# Patient Record
Sex: Female | Born: 1965
Health system: Southern US, Community
[De-identification: ages and names within clinical notes are randomized; demographics above are authoritative.]

## PROBLEM LIST (undated history)

## (undated) DIAGNOSIS — E785 Hyperlipidemia, unspecified: Secondary | ICD-10-CM

## (undated) DIAGNOSIS — D509 Iron deficiency anemia, unspecified: Secondary | ICD-10-CM

## (undated) DIAGNOSIS — Z8759 Personal history of other complications of pregnancy, childbirth and the puerperium: Secondary | ICD-10-CM

## (undated) DIAGNOSIS — Z87820 Personal history of traumatic brain injury: Secondary | ICD-10-CM

## (undated) DIAGNOSIS — N9489 Other specified conditions associated with female genital organs and menstrual cycle: Secondary | ICD-10-CM

## (undated) DIAGNOSIS — B009 Herpesviral infection, unspecified: Secondary | ICD-10-CM

## (undated) DIAGNOSIS — G43909 Migraine, unspecified, not intractable, without status migrainosus: Secondary | ICD-10-CM

## (undated) DIAGNOSIS — N950 Postmenopausal bleeding: Secondary | ICD-10-CM

## (undated) DIAGNOSIS — Z973 Presence of spectacles and contact lenses: Secondary | ICD-10-CM

## (undated) DIAGNOSIS — Q6589 Other specified congenital deformities of hip: Secondary | ICD-10-CM

## (undated) HISTORY — DX: Herpesviral infection, unspecified: B00.9

## (undated) HISTORY — DX: Other specified congenital deformities of hip: Q65.89

## (undated) HISTORY — PX: OTHER SURGICAL HISTORY: SHX169

## (undated) HISTORY — DX: Hyperlipidemia, unspecified: E78.5

## (undated) HISTORY — DX: Migraine, unspecified, not intractable, without status migrainosus: G43.909

---

## 1990-10-29 HISTORY — PX: SALPINGOSTOMY: SHX2372

## 1999-10-01 ENCOUNTER — Other Ambulatory Visit: Admission: RE | Admit: 1999-10-01 | Discharge: 1999-10-01 | Payer: Self-pay | Admitting: Obstetrics and Gynecology

## 2000-10-13 ENCOUNTER — Other Ambulatory Visit: Admission: RE | Admit: 2000-10-13 | Discharge: 2000-10-13 | Payer: Self-pay | Admitting: *Deleted

## 2001-12-19 ENCOUNTER — Other Ambulatory Visit: Admission: RE | Admit: 2001-12-19 | Discharge: 2001-12-19 | Payer: Self-pay | Admitting: Obstetrics and Gynecology

## 2002-12-21 ENCOUNTER — Other Ambulatory Visit: Admission: RE | Admit: 2002-12-21 | Discharge: 2002-12-21 | Payer: Self-pay | Admitting: Obstetrics and Gynecology

## 2004-01-01 ENCOUNTER — Other Ambulatory Visit: Admission: RE | Admit: 2004-01-01 | Discharge: 2004-01-01 | Payer: Self-pay | Admitting: *Deleted

## 2005-02-05 ENCOUNTER — Other Ambulatory Visit: Admission: RE | Admit: 2005-02-05 | Discharge: 2005-02-05 | Payer: Self-pay | Admitting: Obstetrics and Gynecology

## 2006-02-09 ENCOUNTER — Other Ambulatory Visit: Admission: RE | Admit: 2006-02-09 | Discharge: 2006-02-09 | Payer: Self-pay | Admitting: Obstetrics and Gynecology

## 2007-02-15 ENCOUNTER — Other Ambulatory Visit: Admission: RE | Admit: 2007-02-15 | Discharge: 2007-02-15 | Payer: Self-pay | Admitting: Obstetrics and Gynecology

## 2007-04-24 ENCOUNTER — Encounter: Payer: Self-pay | Admitting: Obstetrics and Gynecology

## 2007-04-24 ENCOUNTER — Ambulatory Visit (HOSPITAL_BASED_OUTPATIENT_CLINIC_OR_DEPARTMENT_OTHER): Admission: RE | Admit: 2007-04-24 | Discharge: 2007-04-24 | Payer: Self-pay | Admitting: Obstetrics and Gynecology

## 2008-03-13 ENCOUNTER — Other Ambulatory Visit: Admission: RE | Admit: 2008-03-13 | Discharge: 2008-03-13 | Payer: Self-pay | Admitting: Obstetrics and Gynecology

## 2010-04-20 LAB — HM PAP SMEAR

## 2011-01-05 ENCOUNTER — Other Ambulatory Visit: Payer: Self-pay | Admitting: Allergy and Immunology

## 2011-01-05 DIAGNOSIS — R0981 Nasal congestion: Secondary | ICD-10-CM

## 2011-01-12 NOTE — Op Note (Signed)
Allison Crawford, Allison Crawford              ACCOUNT NO.:  [id number]   MEDICAL RECORD NO.:  [id number]          PATIENT TYPE:  AMB   LOCATION:  [facility]                         FACILITY:  [facility]   PHYSICIAN:  Cynthia P. Romine, M.D.DATE OF BIRTH:  1966-08-14   DATE OF PROCEDURE:  04/24/2007  DATE OF DISCHARGE:                               OPERATIVE REPORT   PREOPERATIVE DIAGNOSIS:  Menorrhagia, endometrial polyps on  sonohysterogram.   POSTOPERATIVE DIAGNOSIS:  Menorrhagia, endometrial polyps, path pending.   PROCEDURE:  Examination under anesthesia, hysteroscopy with resection of  endometrial polyp, D&C.   SURGEON:  Cynthia P. Romine, M.D.   ANESTHESIA:  General by LMA.   BLOOD LOSS:  Minimal.   SORBITOL DEFICIT:  20 mL.   COMPLICATIONS:  None.   PROCEDURE:  The patient was taken to the operating room and after  induction of adequate general anesthesia was placed in dorsal lithotomy  position and prepped and draped in the usual fashion.  The bladder was  drained with a red rubber catheter.  A posterior weighted and anterior  Sims retractor were placed.  The cervix was grasped on its anterior lip  with a single-tooth tenaculum.  Paracervical block was instituted by  injecting 10 mL of 1% plain Xylocaine at each of 3 and 9 o'clock. The  uterus was then sounded to 8 cm.  The cervix was then dilated to #31  Shawnie Pons.  The cervix offered very little resistance with dilation. The  hysteroscope was introduced.  Several endometrial polyps were noted in  the lower uterine segment and the right and left cornual areas. Several  passes were made to resect the polyps with the resectoscope, scope was  then withdrawn.  Sharp curettage was carried out a moderate amount of  tissue was obtained.  The scope was then reinserted, one more polyp was  resected and photographic documentation was taken of the clean cavity.  Scope was withdrawn.  The instruments removed from vagina and the  procedure was  terminated.  The patient tolerated it well and went in  satisfactory condition to post anesthesia recovery.      Cynthia P. Romine, M.D.  Electronically Signed     CPR/MEDQ  D:  04/24/2007  T:  04/25/2007  Job:  [id number]

## 2011-06-11 LAB — CBC
Hemoglobin: 12.3
RDW: 12.7

## 2011-06-11 LAB — PREGNANCY, URINE: Preg Test, Ur: NEGATIVE

## 2012-11-07 LAB — HM MAMMOGRAPHY

## 2013-04-14 ENCOUNTER — Telehealth: Payer: Self-pay | Admitting: Obstetrics & Gynecology

## 2013-04-14 MED ORDER — VALACYCLOVIR HCL 1 G PO TABS: ORAL_TABLET | ORAL | Status: DC

## 2013-04-14 NOTE — Telephone Encounter (Signed)
Patient called asking for valtrex rx.  Having symptoms of fever blister.  Has valtrex that is four years old.  hasn't had any symptoms since them.  Didn't think it was good to take med that old.  Very grateful I called her so quickly.

## 2013-05-18 ENCOUNTER — Ambulatory Visit: Payer: Self-pay | Admitting: Obstetrics and Gynecology

## 2013-05-18 ENCOUNTER — Ambulatory Visit (INDEPENDENT_AMBULATORY_CARE_PROVIDER_SITE_OTHER): Payer: BC Managed Care – PPO | Admitting: Obstetrics and Gynecology

## 2013-05-18 ENCOUNTER — Encounter: Payer: Self-pay | Admitting: Obstetrics and Gynecology

## 2013-05-18 VITALS — BP 120/70 | HR 62 | Ht 62.5 in | Wt 150.5 lb

## 2013-05-18 DIAGNOSIS — Z01419 Encounter for gynecological examination (general) (routine) without abnormal findings: Secondary | ICD-10-CM

## 2013-05-18 DIAGNOSIS — Z0000 Encounter for general adult medical examination without abnormal findings: Secondary | ICD-10-CM

## 2013-05-18 LAB — POCT URINALYSIS DIPSTICK
Leukocytes, UA: NEGATIVE
Nitrite, UA: NEGATIVE
Protein, UA: NEGATIVE
pH, UA: 5

## 2013-05-18 NOTE — Patient Instructions (Addendum)

## 2013-05-18 NOTE — Progress Notes (Signed)
Patient ID: Allison Crawford, female   DOB: [birth year], 47 y.o.   MRN: [id number] GYNECOLOGY VISIT  PCP: Merri Brunette, MD  Referring provider:   HPI: 47 y.o.   Married  Caucasian  female   [phone] with Patient's last menstrual period was 04/25/2013.   here for  AEX.  Menses in last 6 - 12 months are more irregular.  Never goes more than 2 months without menses.  This summer had a lot of spotting for a few weeks.  This stopped and then had a normal 5 menses.  Occasional hot flash and night sweats. No vaginal dryness.   Hgb:  PCP Urine: Trace RBC's   GYNECOLOGIC HISTORY: Patient's last menstrual period was 04/25/2013. Sexually active: yes  Partner preference: female Contraception: vasectomy   Menopausal hormone therapy: no DES exposure:   no Blood transfusions: no   Sexually transmitted diseases:  no  GYN Procedures: D & C/hysteroscopy Mammogram: 11-07-12 JYN:WGNFA                Pap:  04-20-10 wnl  History of abnormal pap smear:  no   OB History   Grav Para Term Preterm Abortions TAB SAB Ect Mult Living   5 2 2  3  3   2        LIFESTYLE: Exercise:   Tennis and bicycling           Tobacco:   no Alcohol:      8-10 glasses of wine per week Drug use:   no  OTHER HEALTH MAINTENANCE: Tetanus/TDap: 2004 Gardisil: Influenza:  never Zostavax:  Bone density:  never Colonoscopy:  never  Cholesterol check:  08/2012 total cholesterol elevated but ratios are great.  Labs through husband's work.  Family History  Problem Relation Age of Onset  . Migraines Mother   . Hypertension Mother   . Osteoarthritis Mother   . Hyperlipidemia Mother   . Melanoma Mother   . Hypertension Father   . Osteoarthritis Father   . Hyperlipidemia Father   . Migraines Sister   . Melanoma Sister     There are no active problems to display for this patient.  Past Medical History  Diagnosis Date  . Migraine   . Hip dysplasia   . Concussion     age 47  . Anemia     Past Surgical History   Procedure Laterality Date  . Salpingostomy Right 10/1990    --ectopic  . Hysteroscopy  03/2007    resection of polyp and D & C--Dr. Tresa Res    ALLERGIES: Review of patient's allergies indicates no known allergies.  Current Outpatient Prescriptions  Medication Sig Dispense Refill  . calcium carbonate (TUMS - DOSED IN MG ELEMENTAL CALCIUM) 500 MG chewable tablet Chew 2 tablets by mouth daily.      Marland Kitchen KRILL OIL PO Take 1 capsule by mouth daily.      . Multiple Vitamin (MULTIVITAMIN) capsule Take 1 capsule by mouth daily.      . valACYclovir (VALTREX) 1000 MG tablet 2 tabs at symptom onset and 2 tabs 12 hrs later.  30 tablet  0   No current facility-administered medications for this visit.     ROS:  Pertinent items are noted in HPI.  SOCIAL HISTORY:  Editor, commissioning  PHYSICAL EXAMINATION:    BP 120/70  Pulse 62  Ht 5' 2.5" (1.588 m)  Wt 150 lb 8 oz (68.266 kg)  BMI 27.07 kg/m2  LMP 04/25/2013   Wt Readings from Last  3 Encounters:  05/18/13 150 lb 8 oz (68.266 kg)     Ht Readings from Last 3 Encounters:  05/18/13 5' 2.5" (1.588 m)    General appearance: alert, cooperative and appears stated age Head: Normocephalic, without obvious abnormality, atraumatic Neck: no adenopathy, supple, symmetrical, trachea midline and thyroid not enlarged, symmetric, no tenderness/mass/nodules Lungs: clear to auscultation bilaterally Breasts: Inspection negative, No nipple retraction or dimpling, No nipple discharge or bleeding, No axillary or supraclavicular adenopathy, Normal to palpation without dominant masses Heart: regular rate and rhythm Abdomen: soft, non-tender; no masses,  no organomegaly Extremities: extremities normal, atraumatic, no cyanosis or edema Skin: Skin color, texture, turgor normal. No rashes or lesions Lymph nodes: Cervical, supraclavicular, and axillary nodes normal. No abnormal inguinal nodes palpated Neurologic: Grossly normal  Pelvic: External genitalia:  no  lesions              Urethra:  normal appearing urethra with no masses, tenderness or lesions              Bartholins and Skenes: normal                 Vagina: normal appearing vagina with normal color and discharge, no lesions              Cervix: normal appearance              Pap and high risk HPV testing done: yes.            Bimanual Exam:  Uterus:  uterus is normal size, shape, consistency and nontender                                      Adnexa: normal adnexa in size, nontender and no masses                                      Rectovaginal: Confirms                                      Anus:  normal sphincter tone, no lesions  ASSESSMENT  Normal gynecologic exam.  PLAN  Mammogram yearly Pap smear and high risk HPV testing Counseled on perimenopause. Call for prolonged episodes of spotting or episodes of heavy vaginal bleeding.  Return annually or prn   An After Visit Summary was printed and given to the patient.

## 2013-08-17 ENCOUNTER — Other Ambulatory Visit: Payer: Self-pay | Admitting: Obstetrics and Gynecology

## 2013-08-17 MED ORDER — VALACYCLOVIR HCL 1 G PO TABS: ORAL_TABLET | ORAL | Status: DC

## 2013-08-17 NOTE — Telephone Encounter (Signed)
AEX 05/08/13 04/14/13  #30/0 refills was sent Current AEX Scheduled for 05/20/14  Okay to give #30/1 refill?

## 2013-08-17 NOTE — Telephone Encounter (Signed)
Patient is asking for a refill of   valACYclovir (VALTREX) 1000 MG tablet  2 tabs at symptom onset and 2 tabs 12 hrs later., No Print, Last Dose: Not Recorded  Refills: 0 ordered Pharmacy: [id number] - [place], [place] - [address]

## 2013-08-21 ENCOUNTER — Telehealth: Payer: Self-pay | Admitting: Obstetrics and Gynecology

## 2013-08-21 NOTE — Telephone Encounter (Signed)
Rx was sent 08/17/13 #30/0 refills, s/w pharmacist they didn't have rx. Gave them the rx verbally. Patient notified. 

## 2013-08-21 NOTE — Telephone Encounter (Signed)
Rx was sent 08/17/13 #30/0 refills, s/w pharmacist they didn't have rx. Gave them the rx verbally. Patient notified.

## 2013-08-21 NOTE — Telephone Encounter (Signed)
valACYclovir (VALTREX) 1000 MG tablet  2 tabs at symptom onset and 2 tabs 12 hrs later., No Print, Last Dose: Not Recorded  Refills: 0 ordered Pharmacy: [id number] - [place], [place] - [address]  Patient is asking for refill for above medication Looks like it was sent on Friday to me but her pharmacy said they do not have it.

## 2013-11-21 ENCOUNTER — Telehealth: Payer: Self-pay | Admitting: Obstetrics and Gynecology

## 2013-11-21 NOTE — Telephone Encounter (Signed)
Patient said she wants to see dr Quincy Simmonds said she has a variety of things going on wouldn't not go in to detail.

## 2013-11-21 NOTE — Telephone Encounter (Signed)
Spoke with patient. Patient would like to come in to see Dr.Silva about a "variety" of problems. Patient busy and not willing to offer many details regarding visit.Appointment offered today at 2:30 but patient declined due to conflicting schedule. Appointment scheduled for 3/27 at 1500. Patient agreeable to time and date.  Routing to provider for final review. Patient agreeable to disposition. Will close encounter

## 2013-11-23 ENCOUNTER — Encounter: Payer: Self-pay | Admitting: Obstetrics and Gynecology

## 2013-11-23 ENCOUNTER — Ambulatory Visit (INDEPENDENT_AMBULATORY_CARE_PROVIDER_SITE_OTHER): Payer: BC Managed Care – PPO | Admitting: Obstetrics and Gynecology

## 2013-11-23 DIAGNOSIS — B001 Herpesviral vesicular dermatitis: Secondary | ICD-10-CM

## 2013-11-23 DIAGNOSIS — B009 Herpesviral infection, unspecified: Secondary | ICD-10-CM

## 2013-11-23 DIAGNOSIS — L089 Local infection of the skin and subcutaneous tissue, unspecified: Secondary | ICD-10-CM

## 2013-11-23 MED ORDER — VALACYCLOVIR HCL 500 MG PO TABS: ORAL_TABLET | ORAL | Status: DC

## 2013-11-23 MED ORDER — SULFAMETHOXAZOLE-TRIMETHOPRIM 800-160 MG PO TABS: 1.0000 | ORAL_TABLET | Freq: Two times a day (BID) | ORAL | Status: DC

## 2013-11-23 NOTE — Patient Instructions (Signed)
Take Valtrex 500 mg by mouth daily.  Take the Bactrim DS by mouth twice daily for one week.

## 2013-11-23 NOTE — Progress Notes (Signed)
Patient ID: Allison Crawford, female   DOB: [birth year], 48 y.o.   MRN: [id number] GYNECOLOGY VISIT  PCP:   Deland Pretty, MD  Referring provider:   HPI: 48 y.o.   Married  Caucasian  female   [phone] with Patient's last menstrual period was 09/28/2013.   here for  Irregular cycles and weight  Gain. Patient states she can't seem to lose weight.  Also having more frequent outbreaks of oral HSV.  Took Valtrex prn and this reduces symptoms but they recur.  Using Vaseline to moisturize area.  No new products on face.  Some canker sores.   Skipped menses for the first time ever.  Right breast skin lesion.  Just had normal mammogram.   Difficultly with weight loss.  Thyroid testing normal.   GYNECOLOGIC HISTORY: Patient's last menstrual period was 09/28/2013. Sexually active:  yes Partner preference: female Contraception:   vasectomy Menopausal hormone therapy: n/a DES exposure: no   Blood transfusions: no   Sexually transmitted diseases:  no  GYN procedures and prior surgeries:  Hysteroscopy/D & C, Right salpingostomy 1992--ectopic Last mammogram:  11-08-13 QQV:ZDGLO               Last pap and high risk HPV testing:  05-18-13 wnl:neg HR HPV  History of abnormal pap smear: no    OB History   Grav Para Term Preterm Abortions TAB SAB Ect Mult Living   5 2 2  3  3   2        LIFESTYLE: Exercise:   Cycling, tennis        Tobacco:   no Alcohol:      8 glasses of wine per week Drug use:  no   There are no active problems to display for this patient.   Past Medical History  Diagnosis Date  . Migraine   . Hip dysplasia   . Concussion     age 48  . Anemia     Past Surgical History  Procedure Laterality Date  . Salpingostomy Right 10/1990    --ectopic  . Hysteroscopy  03/2007    resection of polyp and D & C--Dr. Joan Flores    Current Outpatient Prescriptions  Medication Sig Dispense Refill  . calcium carbonate (TUMS - DOSED IN MG ELEMENTAL CALCIUM) 500 MG chewable tablet Chew 2  tablets by mouth daily.      Marland Kitchen KRILL OIL PO Take 1 capsule by mouth daily.      . Multiple Vitamin (MULTIVITAMIN) capsule Take 1 capsule by mouth daily.      . valACYclovir (VALTREX) 1000 MG tablet 2 tabs at symptom onset and 2 tabs 12 hrs later.  30 tablet  0   No current facility-administered medications for this visit.     ALLERGIES: Review of patient's allergies indicates no known allergies.  Family History  Problem Relation Age of Onset  . Migraines Mother   . Hypertension Mother   . Osteoarthritis Mother   . Hyperlipidemia Mother   . Melanoma Mother   . Hypertension Father   . Osteoarthritis Father   . Hyperlipidemia Father   . Cancer Father     oral cancer  . Migraines Sister   . Melanoma Sister   . Diabetes Sister     History   Social History  . Marital Status: Married    Spouse Name: N/A    Number of Children: N/A  . Years of Education: N/A   Occupational History  . Not on file.  Social History Main Topics  . Smoking status: Never Smoker   . Smokeless tobacco: Not on file  . Alcohol Use: 4.0 oz/week    8 drink(s) per week     Comment: 8-10 glasses of wine per wek  . Drug Use: No  . Sexual Activity: Yes    Partners: Male    Birth Control/ Protection: Other-see comments     Comment: vasectomy   Other Topics Concern  . Not on file   Social History Narrative  . No narrative on file    ROS:  Pertinent items are noted in HPI.  PHYSICAL EXAMINATION:    LMP 09/28/2013   Wt Readings from Last 3 Encounters:  05/18/13 150 lb 8 oz (68.266 kg)     Ht Readings from Last 3 Encounters:  05/18/13 5' 2.5" (1.588 m)    General appearance: alert, cooperative and appears stated age 48:  Perioral region with mild erythema and HSV lesions noted. Skin:  Right breast with 1.0 cm erythematous patch of skin with some fluctuance.  Nontender.   No drainage.    ASSESSMENT  Chronic oral HSV. Possible MRSA of breast.  Status post normal mammogram this  month. Weight gain. Perimenopausal female.   PLAN  Will start daily Valtrex 500 mg. If oral HSV does not improve, will increase to Valtrex 1000 mg daily. If no improvement then, to ENT for evaluation. Bactrim DS po bid for one week. Return if does not resolve. I discussed weight loss through both diet and exercise.  25 mnutes face to face time of which over 50% was spent in counseling patient.   An After Visit Summary was printed and given to the patient.

## 2013-11-26 ENCOUNTER — Encounter: Payer: Self-pay | Admitting: Obstetrics and Gynecology

## 2013-11-30 ENCOUNTER — Telehealth: Payer: Self-pay | Admitting: Obstetrics & Gynecology

## 2013-11-30 MED ORDER — DOXYCYCLINE HYCLATE 100 MG PO CAPS: 100.0000 mg | ORAL_CAPSULE | Freq: Two times a day (BID) | ORAL | Status: DC

## 2013-11-30 NOTE — Telephone Encounter (Signed)
Patient seen by Dr. Quincy Simmonds for possible MRSA abscess.  Was not I&D'ed.  Pt has been on Bactrim DS BID for 7 days.  Today is her last dosage.  Feeling possible "waves" of fever.  Just doesn't feel great.  Hasn't taken her temperature so not sure if it is sinus related (as this has acutely worsened this week), the breast finding, or menopausal symptoms (which have been worsening as well).  Pt on her way to parents in [place], [place], to parents.  Driving as we are speaking.  Phone connection difficult.  Breast area is less tender and smaller with some "drying" around the edges.  Not gone.  The center portion is still present.  No drainage.  Will switch ABX to doxycycline 100mg  bid.  Pt will finish bactrim.  Pt advised to take temp with next "wave" and if >100.5, she needs to be seen at local urgent care for evaluation and possible breast I&D. She voices clear understanding of this.  Will see pt on Monday in office.  Message sent to [facility] to call pt first thing Monday to schedule appt.

## 2013-12-03 ENCOUNTER — Encounter: Payer: Self-pay | Admitting: Obstetrics and Gynecology

## 2013-12-03 ENCOUNTER — Ambulatory Visit (INDEPENDENT_AMBULATORY_CARE_PROVIDER_SITE_OTHER): Payer: BC Managed Care – PPO | Admitting: Obstetrics and Gynecology

## 2013-12-03 VITALS — BP 116/76 | HR 72 | Temp 97.8°F | Resp 16 | Ht 62.5 in | Wt 149.0 lb

## 2013-12-03 DIAGNOSIS — T7840XA Allergy, unspecified, initial encounter: Secondary | ICD-10-CM

## 2013-12-03 DIAGNOSIS — L988 Other specified disorders of the skin and subcutaneous tissue: Secondary | ICD-10-CM

## 2013-12-03 DIAGNOSIS — N649 Disorder of breast, unspecified: Secondary | ICD-10-CM

## 2013-12-03 MED ORDER — VALACYCLOVIR HCL 500 MG PO TABS: ORAL_TABLET | ORAL | Status: DC

## 2013-12-03 NOTE — Progress Notes (Signed)
Patient ID: SANDEE BERNATH, female   DOB: 28-Sep-1965, 48 y.o.   MRN: [id number]  GYNECOLOGY  VISIT   HPI: 48 y.o.   Married  Caucasian  female   [phone] with Patient's last menstrual period was 09/28/2013.   here for recheck. Patient received sulfa Rx for potential MRSA.   Took all but the final dosage of a one week Rx.  Called the on call MD and was reporting not feeling well.   Told to stop Sulfa Rx and was given Rx for Doxycycline.   Took only one dosage of this. Patient developed progressive redness of face and rash on her arms, which started while she was on the Bactrim.  No fever.  Patient went to ER in [place]. Treated with IV Benadryl and IVF. Took one Doxycycline on Friday, 3 days ago.  Breast area is unchanged.   Perioral area is better on the daily Valtrex.   Patient's dermatologist is Dr. Rolm Bookbinder.   GYNECOLOGIC HISTORY: Patient's last menstrual period was 09/28/2013. Contraception:    Menopausal hormone therapy:         OB History   Grav Para Term Preterm Abortions TAB SAB Ect Mult Living   5 2 2  3  3   2          There are no active problems to display for this patient.   Past Medical History  Diagnosis Date  . Migraine   . Hip dysplasia   . Concussion     age 89  . Anemia     Past Surgical History  Procedure Laterality Date  . Salpingostomy Right 10/1990    --ectopic  . Hysteroscopy  03/2007    resection of polyp and D & C--Dr. Joan Flores    Current Outpatient Prescriptions  Medication Sig Dispense Refill  . calcium carbonate (TUMS - DOSED IN MG ELEMENTAL CALCIUM) 500 MG chewable tablet Chew 2 tablets by mouth daily.      Marland Kitchen KRILL OIL PO Take 1 capsule by mouth daily.      . Multiple Vitamin (MULTIVITAMIN) capsule Take 1 capsule by mouth daily.      . valACYclovir (VALTREX) 500 MG tablet Take one tablet by mouth daily.  90 tablet  3   No current facility-administered medications for this visit.     ALLERGIES: Doxycycline and  Bactrim  Family History  Problem Relation Age of Onset  . Migraines Mother   . Hypertension Mother   . Osteoarthritis Mother   . Hyperlipidemia Mother   . Melanoma Mother   . Hypertension Father   . Osteoarthritis Father   . Hyperlipidemia Father   . Cancer Father     oral cancer  . Migraines Sister   . Melanoma Sister   . Diabetes Sister     History   Social History  . Marital Status: Married    Spouse Name: N/A    Number of Children: N/A  . Years of Education: N/A   Occupational History  . Not on file.   Social History Main Topics  . Smoking status: Never Smoker   . Smokeless tobacco: Not on file  . Alcohol Use: 4.0 oz/week    8 drink(s) per week     Comment: 8-10 glasses of wine per wek  . Drug Use: No  . Sexual Activity: Yes    Partners: Male    Birth Control/ Protection: Other-see comments     Comment: vasectomy   Other Topics Concern  . Not on  file   Social History Narrative  . No narrative on file    ROS:  Pertinent items are noted in HPI.  PHYSICAL EXAMINATION:    BP 116/76  Pulse 72  Temp(Src) 97.8 F (36.6 C) (Oral)  Resp 16  Ht 5' 2.5" (1.588 m)  Wt 149 lb (67.586 kg)  BMI 26.80 kg/m2  LMP 09/28/2013     General appearance: alert, cooperative and appears stated age. Breasts:  Right breast with 1.0 cm erythematous area and small eschar and violaceous area, nontender on inferior breast.  No dominant masses, axillary adenopathy, nipple discharge, or skin retractions  Left breast with no dominant masses, axillary adenopathy, nipple discharge, or skin retractions.   ASSESSMENT  Right breast skin lesion of undetermined etiology.  Allergic reaction to sulfa.  Questionable reaction to doxycycline.  Chronic oral HSV.  On Valtrex.   PLAN  Will send to dermatology for evaluation of the breast.  Continue daily Valtrex 500 mg.  Reordered as patient said it did not go through the pharmacy.  (Looks like it went through on Epic at last visit.)   Return prn.    An After Visit Summary was printed and given to the patient.  _20_____ minutes face to face time of which over 50% was spent in counseling.

## 2013-12-03 NOTE — Progress Notes (Signed)
Call to Dr Rolm Bookbinder office to schedule appointment.  Dr Ubaldo Glassing schedule is full and the records will need to be faxed and triage will review those records with MD and they will contact her with an appointment.Patient declined appointment on Thursday or Friday with another provider in their office.

## 2013-12-11 ENCOUNTER — Telehealth: Payer: Self-pay | Admitting: Obstetrics and Gynecology

## 2013-12-11 NOTE — Telephone Encounter (Signed)
I just got a consultation note from Dermatology about this patient. It looks like she was already seen.  I will close the encounter.

## 2013-12-11 NOTE — Telephone Encounter (Signed)
Per Stanton Kidney, at Dr. Margorie John office, the patient scheduled and cancelled this appointment. The patient has an appointment scheduled in May for another issue, perhaps she will address this issue then.

## 2014-05-20 ENCOUNTER — Ambulatory Visit (INDEPENDENT_AMBULATORY_CARE_PROVIDER_SITE_OTHER): Payer: BC Managed Care – PPO | Admitting: Obstetrics and Gynecology

## 2014-05-20 ENCOUNTER — Encounter: Payer: Self-pay | Admitting: Obstetrics and Gynecology

## 2014-05-20 ENCOUNTER — Ambulatory Visit: Payer: BC Managed Care – PPO

## 2014-05-20 VITALS — BP 120/64 | HR 60 | Resp 14 | Ht 62.5 in | Wt 148.8 lb

## 2014-05-20 DIAGNOSIS — Z01419 Encounter for gynecological examination (general) (routine) without abnormal findings: Secondary | ICD-10-CM

## 2014-05-20 DIAGNOSIS — Z0000 Encounter for general adult medical examination without abnormal findings: Secondary | ICD-10-CM

## 2014-05-20 LAB — POCT URINALYSIS DIPSTICK
Bilirubin, UA: NEGATIVE
GLUCOSE UA: NEGATIVE
Ketones, UA: NEGATIVE
Leukocytes, UA: NEGATIVE
NITRITE UA: NEGATIVE
PROTEIN UA: NEGATIVE
RBC UA: NEGATIVE
UROBILINOGEN UA: NEGATIVE
pH, UA: 5

## 2014-05-20 MED ORDER — VALACYCLOVIR HCL 500 MG PO TABS: 500.0000 mg | ORAL_TABLET | Freq: Every day | ORAL | Status: DC

## 2014-05-20 MED ORDER — VALACYCLOVIR HCL 500 MG PO TABS: ORAL_TABLET | ORAL | Status: DC

## 2014-05-20 NOTE — Patient Instructions (Signed)

## 2014-05-20 NOTE — Progress Notes (Signed)
Patient ID: Allison Crawford, female   DOB: August 20, 1966, 48 y.o.   MRN: [id number] GYNECOLOGY VISIT  PCP:   Deland Pretty, MD  Referring provider:   HPI: 48 y.o.   Married  Caucasian  female   [phone] with Patient's last menstrual period was 05/18/2014.   here for  AEX.  Menses very regular.   Has a Journalist, newspaper.   Breast area resolved.  Had a negative culture.   Has a bruise on her right arm and a small lump under skin.  Saw her PCP who was not worried.   Hgb:    Labs checked through husband's work Urine:  Neg  GYNECOLOGIC HISTORY: Patient's last menstrual period was 05/18/2014. Sexually active:  yes Partner preference: female Contraception:  vasectomy  Menopausal hormone therapy: n/a DES exposure: no   Blood transfusions:   no Sexually transmitted diseases:  no  GYN procedures and prior surgeries: D & C/hystereoscopy  Last mammogram:  11-08-13 heterogeneously dense,otherwise HWE:[id number]             Last pap and high risk HPV testing:  05-18-13 wnl:neg HR HPV  History of abnormal pap smear:  no   OB History   Grav Para Term Preterm Abortions TAB SAB Ect Mult Living   5 2 2  3  3   2        LIFESTYLE: Exercise:   Biking and tennis            OTHER HEALTH MAINTENANCE: Tetanus/TDap:  2004 - declined.  HPV:                  n/a Influenza:           never   Bone density:   n/a Colonoscopy:   To be scheduled this year with Dr. Raymondo Band by Dr. Shelia Media.  Cholesterol check: 08/2013  Elevated but ratios are good (this was checked through husband's work)  Family History  Problem Relation Age of Onset  . Migraines Mother   . Hypertension Mother   . Osteoarthritis Mother   . Hyperlipidemia Mother   . Melanoma Mother   . Hypertension Father   . Osteoarthritis Father   . Hyperlipidemia Father   . Cancer Father     oral cancer  . Migraines Sister   . Melanoma Sister   . Diabetes Sister     There are no active problems to display for this patient.  Past  Medical History  Diagnosis Date  . Migraine   . Hip dysplasia   . Concussion     age 48  . Anemia     Past Surgical History  Procedure Laterality Date  . Salpingostomy Right 10/1990    --ectopic  . Hysteroscopy  03/2007    resection of polyp and D & C--Dr. Joan Flores    ALLERGIES: Doxycycline and Bactrim  Current Outpatient Prescriptions  Medication Sig Dispense Refill  . calcium carbonate (TUMS - DOSED IN MG ELEMENTAL CALCIUM) 500 MG chewable tablet Chew 2 tablets by mouth daily.      Marland Kitchen KRILL OIL PO Take 1 capsule by mouth daily.      . Multiple Vitamin (MULTIVITAMIN) capsule Take 1 capsule by mouth daily.      . valACYclovir (VALTREX) 500 MG tablet Take one tablet by mouth daily.  90 tablet  3   No current facility-administered medications for this visit.     ROS:  Pertinent items are noted in HPI.  History   Social History  .  Marital Status: Married    Spouse Name: N/A    Number of Children: N/A  . Years of Education: N/A   Occupational History  . Not on file.   Social History Main Topics  . Smoking status: Never Smoker   . Smokeless tobacco: Not on file  . Alcohol Use: 5.0 oz/week    10 drink(s) per week     Comment: 8-10 glasses of wine per wek  . Drug Use: No  . Sexual Activity: Yes    Partners: Male    Birth Control/ Protection: Other-see comments     Comment: vasectomy   Other Topics Concern  . Not on file   Social History Narrative  . No narrative on file    PHYSICAL EXAMINATION:    BP 120/64  Pulse 60  Resp 14  Ht 5' 2.5" (1.588 m)  Wt 148 lb 12.8 oz (67.495 kg)  BMI 26.77 kg/m2  LMP 05/18/2014   Wt Readings from Last 3 Encounters:  05/20/14 148 lb 12.8 oz (67.495 kg)  12/03/13 149 lb (67.586 kg)  05/18/13 150 lb 8 oz (68.266 kg)     Ht Readings from Last 3 Encounters:  05/20/14 5' 2.5" (1.588 m)  12/03/13 5' 2.5" (1.588 m)  05/18/13 5' 2.5" (1.588 m)    General appearance: alert, cooperative and appears stated age Head:  Normocephalic, without obvious abnormality, atraumatic Neck: no adenopathy, supple, symmetrical, trachea midline and thyroid not enlarged, symmetric, no tenderness/mass/nodules Lungs: clear to auscultation bilaterally Breasts: Inspection negative, No nipple retraction or dimpling, No nipple discharge or bleeding, No axillary or supraclavicular adenopathy, Normal to palpation without dominant masses Heart: regular rate and rhythm Abdomen: soft, non-tender; no masses,  no organomegaly Extremities: extremities normal, atraumatic, no cyanosis or edema Skin: Skin color, texture, turgor normal. No rashes or lesions Lymph nodes: Cervical, supraclavicular, and axillary nodes normal. No abnormal inguinal nodes palpated Neurologic: Grossly normal  Pelvic: External genitalia:  no lesions              Urethra:  normal appearing urethra with no masses, tenderness or lesions              Bartholins and Skenes: normal                 Vagina: normal appearing vagina with normal color and discharge, no lesions              Cervix: normal appearance.  Menstrual flow noted.               Pap and high risk HPV testing done: No.        Bimanual Exam:  Uterus:  uterus is normal size, shape, consistency and nontender                                      Adnexa: normal adnexa in size, nontender and no masses                                      Rectovaginal:  Yes.                                        Confirms above.  Anus:  normal sphincter tone, no lesions  ASSESSMENT  Normal gynecologic exam. Due for tetanus vaccine.  Declines today but will return this week.   PLAN  Mammogram recommended yearly starting at age 50. Pap smear and high risk HPV testing as above. Counseled on self breast exam, Calcium and vitamin D intake, exercise. See lab orders: No. TDap this week in office.  Colonoscopy recommended.  Return annually or prn   An After Visit Summary was printed  and given to the patient.

## 2014-05-23 ENCOUNTER — Ambulatory Visit (INDEPENDENT_AMBULATORY_CARE_PROVIDER_SITE_OTHER): Payer: BC Managed Care – PPO | Admitting: Obstetrics and Gynecology

## 2014-05-23 VITALS — BP 124/82 | HR 72 | Ht 62.5 in | Wt 147.0 lb

## 2014-05-23 DIAGNOSIS — Z23 Encounter for immunization: Secondary | ICD-10-CM

## 2014-05-23 NOTE — Progress Notes (Signed)
Encounter reviewed by Dr. Brook Silva.  

## 2014-05-23 NOTE — Progress Notes (Signed)
Allison Crawford is a 48 y.o. female. Allergies and identity verified. Boostrix Vaccine given in IM in L Deltoid per Dr. Elza Rafter order.  Consent signed, questions answered. Given VIS dated 10/23/2013. Patient Tolerated Injection well. Karen Chafe, BSN RN

## 2014-07-01 ENCOUNTER — Encounter: Payer: Self-pay | Admitting: Obstetrics and Gynecology

## 2014-11-26 ENCOUNTER — Telehealth: Payer: Self-pay | Admitting: Obstetrics and Gynecology

## 2014-11-26 NOTE — Telephone Encounter (Signed)
Order has been faxed with fax confirmation received. Routing to provider for final review. Patient agreeable to disposition. Will close encounter

## 2014-11-26 NOTE — Telephone Encounter (Signed)
Allison Crawford from [facility] is calling to check on status of diagnostic MMG right breast follow up from screening. Patient has appointment today at 10:30 AM. I checked with Olivia Mackie in triage and fax was sent this morning already. I called Jessica at [facility] and let her know she should be getting that soon.

## 2014-12-19 ENCOUNTER — Telehealth: Payer: Self-pay | Admitting: Obstetrics and Gynecology

## 2014-12-19 MED ORDER — VALACYCLOVIR HCL 1 G PO TABS: ORAL_TABLET | ORAL | Status: DC

## 2014-12-19 NOTE — Telephone Encounter (Signed)
Spoke with patient. Patient states "I am having a really bad break out. My whole upper lip is swollen. This is like one I have never had before." Symptoms began on Sunday 12/15/2014. Patient is on daily suppression of Valtrex 500mg  daily. Patient took 4 tablets Monday, 1 tablet Tuesday and Wednesday, and 2 tablets this morning. Patient called in refill for Valtrex 500mg  daily but requesting to know how to manage her symptoms. "I think I will need another prescription because if I am doubling up on this one then I will run out early and wont have enough to take it daily for the whole month." Advised will speak with Dr.Silva regarding dosage recommendations and return call. Patient is agreeable.

## 2014-12-19 NOTE — Telephone Encounter (Signed)
Patient calling stating her pharmacy says it is one day too early to fill Rx for Valtrex She said, "I am having a bad outbreak, perhaps a different strength of something stronger will work."

## 2014-12-19 NOTE — Telephone Encounter (Signed)
I recommend the following for the patient: Valtrex 1000 mg po q 12 hours for 24 hours.  Then increase Valtrex to 1000 mg daily for suppression.  Please give patient a new prescription for Valtrex 1000 mg tablets so she has enough until her annual exam is due.

## 2014-12-19 NOTE — Telephone Encounter (Signed)
Patient is having a herpes outbreak and is asking for a refill of Valtrex at a different dose. Patient said "this is unlike any outbreak I have had in the past".

## 2014-12-19 NOTE — Telephone Encounter (Signed)
Spoke with patient's husband Allison Crawford. Okay per ROI. Patient is napping and unable to speak. Advised husband of message as seen below from [facility] [facility]. Allison Crawford is agreeable and will let patient know. Rx for Valtrex 1000 mg po q 12 hours for 24 hours then 1000mg  daily for suppression 5RF until aex sent to [facility]. Will return call with any further questions or needs.  Routing to provider for final review. Patient agreeable to disposition. Will close encounter

## 2015-05-21 ENCOUNTER — Ambulatory Visit: Payer: Self-pay | Admitting: Obstetrics and Gynecology

## 2015-05-26 ENCOUNTER — Ambulatory Visit: Payer: BC Managed Care – PPO | Admitting: Obstetrics and Gynecology

## 2015-05-30 ENCOUNTER — Encounter: Payer: Self-pay | Admitting: Obstetrics and Gynecology

## 2015-05-30 ENCOUNTER — Ambulatory Visit (INDEPENDENT_AMBULATORY_CARE_PROVIDER_SITE_OTHER): Payer: BLUE CROSS/BLUE SHIELD | Admitting: Obstetrics and Gynecology

## 2015-05-30 VITALS — BP 110/80 | HR 56 | Resp 18 | Ht 62.5 in | Wt 154.0 lb

## 2015-05-30 DIAGNOSIS — Z01419 Encounter for gynecological examination (general) (routine) without abnormal findings: Secondary | ICD-10-CM | POA: Diagnosis not present

## 2015-05-30 MED ORDER — VALACYCLOVIR HCL 1 G PO TABS: ORAL_TABLET | ORAL | Status: DC

## 2015-05-30 NOTE — Progress Notes (Signed)
49 y.o. [id number] Married Caucasian female here for annual exam.    Menses skipped this summer.  Skipped 9 weeks.  Now having them monthly.  Had hot flashes when she skipped her cycle. Asking about treatment options if needed.  Had a yeast infection recently.  Wants capsules for boric acid treatment.  She wants to make her own.   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   PCP:  Deland Pretty   Patient's last menstrual period was 05/29/2015.          Sexually active: Yes.    The current method of family planning is vasectomy.    Exercising: Yes.    tennis, biking Smoker:  no  Health Maintenance: Pap:  05/18/13 Neg. HR HPV:neg History of abnormal Pap:  no MMG: 11/22/14 Diagnostic Right BIRADS2:benign  Colonoscopy: 03/14/15 Normal Repeat 10 years.  Dr. Collene Mares. BMD:   Never   TDaP:  2015 Screening Labs:  Hb today: PCP, Urine today: PCP   reports that she has never smoked. She has never used smokeless tobacco. She reports that she drinks about 5.0 oz of alcohol per week. She reports that she does not use illicit drugs.  Past Medical History  Diagnosis Date  . Migraine   . Hip dysplasia   . Concussion     age 49  . Anemia     Past Surgical History  Procedure Laterality Date  . Salpingostomy Right 10/1990    --ectopic  . Hysteroscopy  03/2007    resection of polyp and D & C--Dr. Joan Flores    Current Outpatient Prescriptions  Medication Sig Dispense Refill  . calcium carbonate (TUMS - DOSED IN MG ELEMENTAL CALCIUM) 500 MG chewable tablet Chew 2 tablets by mouth daily.    Marland Kitchen ibuprofen (ADVIL,MOTRIN) 100 MG tablet Take 100 mg by mouth every 8 (eight) hours as needed for fever.    Marland Kitchen KRILL OIL PO Take 1 capsule by mouth daily.    . Multiple Vitamin (MULTIVITAMIN) capsule Take 1 capsule by mouth daily.    . valACYclovir (VALTREX) 1000 MG tablet Take 1 tablet (1,000mg ) q 12 hours for 24 hours. Then take 1 tablet (1,000mg ) daily for suppression. 30 tablet 5  .  valACYclovir (VALTREX) 500 MG tablet Take 1 tablet (500 mg total) by mouth daily. 1 tablet po QD. 30 tablet 12   No current facility-administered medications for this visit.    Family History  Problem Relation Age of Onset  . Migraines Mother   . Hypertension Mother   . Osteoarthritis Mother   . Hyperlipidemia Mother   . Melanoma Mother   . Hypertension Father   . Osteoarthritis Father   . Hyperlipidemia Father   . Cancer Father     oral cancer  . Migraines Sister   . Melanoma Sister   . Diabetes Sister     ROS:  Pertinent items are noted in HPI.  Otherwise, a comprehensive ROS was negative.  Exam:   BP 110/80 mmHg  Pulse 56  Resp 18  Ht 5' 2.5" (1.588 m)  Wt 154 lb (69.854 kg)  BMI 27.70 kg/m2  LMP 05/29/2015    General appearance: alert, cooperative and appears stated age Head: Normocephalic, without obvious abnormality, atraumatic Neck: no adenopathy, supple, symmetrical, trachea midline and thyroid normal to inspection and palpation Lungs: clear to auscultation bilaterally Breasts: normal appearance, no masses or tenderness, Inspection negative, No nipple retraction or dimpling, No nipple discharge or bleeding, No axillary or supraclavicular adenopathy  Heart: regular rate and rhythm Abdomen: soft, non-tender; bowel sounds normal; no masses,  no organomegaly Extremities: extremities normal, atraumatic, no cyanosis or edema Skin: Skin color, texture, turgor normal. No rashes or lesions Lymph nodes: Cervical, supraclavicular, and axillary nodes normal. No abnormal inguinal nodes palpated Neurologic: Grossly normal  Pelvic: External genitalia:  no lesions              Urethra:  normal appearing urethra with no masses, tenderness or lesions              Bartholins and Skenes: normal                 Vagina: normal appearing vagina with normal color and discharge, no lesions              Cervix: no lesions              Pap taken: No. Bimanual Exam:  Uterus:  normal  size, contour, position, consistency, mobility, non-tender              Adnexa: normal adnexa and no mass, fullness, tenderness              Rectovaginal: Yes.  .  Confirms.              Anus:  normal sphincter tone, no lesions  Chaperone was present for exam.  Assessment:   Well woman visit with normal exam. Hx HSV.  Perimenopausal female Recent yeast infection.   Plan: Yearly mammogram recommended after age 33.  Recommended self breast exam.  Pap and HR HPV as above. Recommendation for Calcium, Vitamin D, regular exercise program including cardiovascular and weight bearing exercise. Labs performed.  No..   See orders. Refills given on medications.  Yes.  .  See orders.  Valtrex.   Discussed treatment of menopausal symptoms with herbal remedies, HRT, SSRIs/SNRIs. Hand written rx for gelatin capsules for vaginal boric acid treatments.  Declines Rx for boric acid, just wants the capsules. Discussed Hylafem also.  No Rx for this. Follow up annually and prn.      After visit summary provided.

## 2015-05-30 NOTE — Patient Instructions (Signed)
Menopause and Herbal Products Menopause is the normal time of life when menstrual periods stop completely. Menopause is complete when you have missed 12 consecutive menstrual periods. It usually occurs between the ages of 48 to 55, with an average age of 51. Very rarely does a woman develop menopause before 51 years old. At menopause, your ovaries stop producing the female hormones, estrogen and progesterone. This can cause undesirable symptoms and also affect your health. Sometimes the symptoms can occur 4 to 5 years before the menopause begins. There is no relationship between menopause and:  Oral contraceptives.  Number of children you had.  Race.  The age your menstrual periods started (menarche). Heavy smokers and very thin women may develop menopause earlier in life. Estrogen and progesterone hormone treatment is the usual method of treating menopausal symptoms. However, there are women who should not take hormone treatment. This is true of:   Women that have breast or uterine cancer.  Women who prefer not to take hormones because of certain side effects (abnormal uterine bleeding).  Women who are afraid that hormones may cause breast cancer.  Women who have a history of liver disease, heart disease, stroke, or blood clots. For these women, there are other medications that may help treat their menopausal symptoms. These medications are found in plants and botanical products. They can be found in the form of herbs, teas, oils, tinctures, and pills.  CAUSES:  The ovaries stop producing the female hormones estrogen and progesterone.  Other causes include:  Surgery to remove both ovaries.  The ovaries stop functioning for no know reason.  Tumors of the pituitary gland in the brain.  Medical disease that affects the ovaries and hormone production.  Radiation treatment to the abdomen or pelvis.  Chemotherapy that affects the ovaries. PHYTOESTROGENS: Phytoestrogens occur  naturally in plants and plant products. They act like estrogen in the body. Herbal medications are made from these plants and botanical steroids. There are 3 types of phytoestrogens:  Isoflavones (genistein and daidzein) are found in soy, garbanzo beans, miso and tofu foods.  Ligins are found in the shell of seeds. They are used to make oils like flaxseed oil. The bacteria in your intestine act on these foods to produce the estrogen-like hormones.  Coumestans are estrogen-like. Some of the foods they are found in include sunflower seeds and bean sprouts. CONDITIONS AND THEIR POSSIBLE HERBAL TREATMENT:  Hot flashes and night sweats.  Soy, black cohosh and evening primrose.  Irritability, insomnia, depression and memory problems.  Chasteberry, ginseng, and soy.  St. John's wort may be helpful for depression. However, there is a concern of it causing cataracts of the eye and may have bad effects on other medications. St. John's wort should not be taken for long time and without your caregiver's advice.  Loss of libido and vaginal and skin dryness.  Wild yam and soy.  Prevention of coronary heart disease and osteoporosis.  Soy and Isoflavones. Several studies have shown that some women benefit from herbal medications, but most of the studies have not consistently shown that these supplements are much better than placebo. Other forms of treatment to help women with menopausal symptoms include a balanced diet, rest, exercise, vitamin and calcium (with vitamin D) supplements, acupuncture, and group therapy when necessary. THOSE WHO SHOULD NOT TAKE HERBAL MEDICATIONS INCLUDE:  Women who are planning on getting pregnant unless told by your caregiver.  Women who are breastfeeding unless told by your caregiver.  Women who are taking other   prescription medications unless told by your caregiver.  Infants, children, and elderly women unless told by your caregiver. Different herbal medications  have different and unmeasured amounts of the herbal ingredients. There are no regulations, quality control, and standardization of the ingredients in herbal medications. Therefore, the amount of the ingredient in the medication may vary from one herb, pill, tea, oil or tincture to another. Many herbal medications can cause serious problems and can even have poisonous effects if taken too much or too long. If problems develop, the medication should be stopped and recorded by your caregiver. HOME CARE INSTRUCTIONS  Do not take or give children herbal medications without your caregiver's advice.  Let your caregiver know all the medications you are taking. This includes prescription, over-the-counter, eye drops, and creams.  Do not take herbal medications longer or more than recommended.  Tell your caregiver about any side effects from the medication. SEEK MEDICAL CARE IF:  You develop a fever of 102 F (38.9 C), or as directed by your caregiver.  You feel sick to your stomach (nauseous), vomit, or have diarrhea.  You develop a rash.  You develop abdominal pain.  You develop severe headaches.  You start to have vision problems.  You feel dizzy or faint.  You start to feel numbness in any part of your body.  You start shaking (have convulsions). Document Released: 02/02/2008 Document Revised: 08/02/2012 Document Reviewed: 09/01/2010 ExitCare Patient Information 2015 [employer]. This information is not intended to replace advice given to you by your health care provider. Make sure you discuss any questions you have with your health care provider.  

## 2015-08-31 DIAGNOSIS — E785 Hyperlipidemia, unspecified: Secondary | ICD-10-CM

## 2015-08-31 HISTORY — DX: Hyperlipidemia, unspecified: E78.5

## 2016-06-02 DIAGNOSIS — F4320 Adjustment disorder, unspecified: Secondary | ICD-10-CM | POA: Diagnosis not present

## 2016-06-07 ENCOUNTER — Encounter: Payer: Self-pay | Admitting: Obstetrics and Gynecology

## 2016-06-07 ENCOUNTER — Ambulatory Visit (INDEPENDENT_AMBULATORY_CARE_PROVIDER_SITE_OTHER): Payer: BC Managed Care – PPO | Admitting: Obstetrics and Gynecology

## 2016-06-07 VITALS — BP 116/78 | HR 70 | Resp 16 | Ht 62.25 in | Wt 158.0 lb

## 2016-06-07 DIAGNOSIS — Z01419 Encounter for gynecological examination (general) (routine) without abnormal findings: Secondary | ICD-10-CM

## 2016-06-07 MED ORDER — VALACYCLOVIR HCL 1 G PO TABS: ORAL_TABLET | ORAL | 11 refills | Status: DC

## 2016-06-07 NOTE — Progress Notes (Signed)
50 y.o. Allison Crawford Married Caucasian female here for annual exam.    Menses are still continuing almost monthly.  Had a menses for 2.5 weeks in July with spotting, but this has not recurred.  Menses can linger and last 10 days.  Hot flashes has resolved. No pain. Hx of polyps.   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 Assunta Curtis daughter.  They are teaching English.  Patient plans to travel to [place] in March 2018.  PCP:   Dr Shelia Media  Patient's last menstrual period was 05/26/2016 (exact date).           Sexually active: Yes.    The current method of family planning is vasectomy.    Exercising: Yes.    tennis, biking Smoker:  no  Health Maintenance: Pap:  05/18/13 neg HPV HR neg History of abnormal Pap:  no MMG:  11-24-15 category c density birads 2:neg Colonoscopy:  03/14/15 neg f/u 55yrs BMD:   none  Result  n/a TDaP: 2015 HIV: in pregnancy.  Hep C: not done Screening Labs:  Hb today: pt wants fasting labs. Urine today: pt declined Self breast exam: done rarely   reports that she has never smoked. She has never used smokeless tobacco. She reports that she drinks about 4.2 - 6.0 oz of alcohol per week . She reports that she does not use drugs.  Past Medical History:  Diagnosis Date  . Anemia   . Concussion    age 73  . Hip dysplasia   . Migraine     Past Surgical History:  Procedure Laterality Date  . HYSTEROSCOPY  03/2007   resection of polyp and D & C--Dr. Joan Flores  . SALPINGOSTOMY Right 10/1990   --ectopic    Current Outpatient Prescriptions  Medication Sig Dispense Refill  . calcium carbonate (TUMS - DOSED IN MG ELEMENTAL CALCIUM) 500 MG chewable tablet Chew 2 tablets by mouth daily.    Marland Kitchen ibuprofen (ADVIL,MOTRIN) 100 MG tablet Take 100 mg by mouth every 8 (eight) hours as needed for fever.    Marland Kitchen KRILL OIL PO Take 1 capsule by mouth daily.    . Multiple Vitamin (MULTIVITAMIN) capsule Take 1 capsule by mouth daily.    .  valACYclovir (VALTREX) 1000 MG tablet Take 1 tablet (1,000mg ) q 12 hours for 24 hours. Then take 1 tablet (1,000mg ) daily for suppression. 30 tablet 11   No current facility-administered medications for this visit.     Family History  Problem Relation Age of Onset  . Migraines Mother   . Hypertension Mother   . Osteoarthritis Mother   . Hyperlipidemia Mother   . Melanoma Mother   . Hypertension Father   . Osteoarthritis Father   . Hyperlipidemia Father   . Cancer Father     oral cancer  . Migraines Sister   . Melanoma Sister   . Diabetes Sister     ROS:  Pertinent items are noted in HPI.  Otherwise, a comprehensive ROS was negative.  Exam:   BP 116/78   Pulse 70   Resp 16   Ht 5' 2.25" (1.581 m)   Wt 158 lb (71.7 kg)   LMP 05/26/2016 (Exact Date)   BMI 28.67 kg/m     General appearance: alert, cooperative and appears stated age Head: Normocephalic, without obvious abnormality, atraumatic Neck: no adenopathy, supple, symmetrical, trachea midline and thyroid normal to inspection and palpation Lungs: clear to auscultation bilaterally Breasts: normal appearance, no masses or  tenderness, No nipple retraction or dimpling, No nipple discharge or bleeding, No axillary or supraclavicular adenopathy Heart: regular rate and rhythm Abdomen: soft, non-tender; no masses, no organomegaly Extremities: extremities normal, atraumatic, no cyanosis or edema Skin: Skin color, texture, turgor normal. No rashes or lesions Lymph nodes: Cervical, supraclavicular, and axillary nodes normal. No abnormal inguinal nodes palpated Neurologic: Grossly normal  Pelvic: External genitalia:  no lesions              Urethra:  normal appearing urethra with no masses, tenderness or lesions              Bartholins and Skenes: normal                 Vagina: normal appearing vagina with normal color and discharge, no lesions.  Pinkish and brownish blood.               Cervix: no lesions              Pap  taken: Yes.   Bimanual Exam:  Uterus:  normal size, contour, position, consistency, mobility, non-tender              Adnexa: no mass, fullness, tenderness              Rectal exam: Yes.  .  Confirms.              Anus:  normal sphincter tone, no lesions  Chaperone was present for exam.  Assessment:   Well woman visit with normal exam. Hx HSV.  Perimenopausal female  Plan: Yearly mammogram recommended after age 30.  Recommended self breast exam.  Pap and HR HPV as above. Keep bleeding calendar to monitor cycles better.  Call for 3 months of skipped cycles or prolonged bleeding episodes. Discussed Calcium, Vitamin D, regular exercise program including cardiovascular and weight bearing exercise. Refill of Valtrex.  Return for fasting labs.   Follow up annually and prn.      After visit summary provided.

## 2016-06-07 NOTE — Patient Instructions (Signed)

## 2016-06-08 ENCOUNTER — Encounter: Payer: Self-pay | Admitting: Obstetrics and Gynecology

## 2016-06-08 DIAGNOSIS — Z1151 Encounter for screening for human papillomavirus (HPV): Secondary | ICD-10-CM | POA: Diagnosis not present

## 2016-06-08 DIAGNOSIS — Z01419 Encounter for gynecological examination (general) (routine) without abnormal findings: Secondary | ICD-10-CM | POA: Diagnosis not present

## 2016-06-10 ENCOUNTER — Other Ambulatory Visit (INDEPENDENT_AMBULATORY_CARE_PROVIDER_SITE_OTHER): Payer: BC Managed Care – PPO

## 2016-06-10 DIAGNOSIS — Z01419 Encounter for gynecological examination (general) (routine) without abnormal findings: Secondary | ICD-10-CM | POA: Diagnosis not present

## 2016-06-10 DIAGNOSIS — Z0000 Encounter for general adult medical examination without abnormal findings: Secondary | ICD-10-CM

## 2016-06-10 LAB — COMPREHENSIVE METABOLIC PANEL
ALBUMIN: 4.2 g/dL (ref 3.6–5.1)
ALT: 22 U/L (ref 6–29)
AST: 19 U/L (ref 10–35)
Alkaline Phosphatase: 41 U/L (ref 33–130)
BUN: 16 mg/dL (ref 7–25)
CHLORIDE: 102 mmol/L (ref 98–110)
CO2: 25 mmol/L (ref 20–31)
Calcium: 9.7 mg/dL (ref 8.6–10.4)
Creat: 0.84 mg/dL (ref 0.50–1.05)
Glucose, Bld: 93 mg/dL (ref 65–99)
POTASSIUM: 4.4 mmol/L (ref 3.5–5.3)
Sodium: 137 mmol/L (ref 135–146)
TOTAL PROTEIN: 6.6 g/dL (ref 6.1–8.1)
Total Bilirubin: 0.6 mg/dL (ref 0.2–1.2)

## 2016-06-10 LAB — LIPID PANEL
CHOL/HDL RATIO: 4 ratio (ref <=5.0)
Cholesterol: 299 mg/dL — ABNORMAL HIGH (ref 125–200)
HDL: 74 mg/dL (ref >=46)
LDL Cholesterol: 196 mg/dL — ABNORMAL HIGH (ref <130)
TRIGLYCERIDES: 147 mg/dL (ref <150)
VLDL: 29 mg/dL (ref <30)

## 2016-06-10 LAB — CBC
HEMATOCRIT: 40.4 % (ref 35.0–45.0)
HEMOGLOBIN: 13.9 g/dL (ref 11.7–15.5)
MCH: 31.3 pg (ref 27.0–33.0)
MCHC: 34.4 g/dL (ref 32.0–36.0)
MCV: 91 fL (ref 80.0–100.0)
MPV: 11.1 fL (ref 7.5–12.5)
Platelets: 279 10*3/uL (ref 140–400)
RBC: 4.44 MIL/uL (ref 3.80–5.10)
RDW: 12.7 % (ref 11.0–15.0)
WBC: 6 10*3/uL (ref 3.8–10.8)

## 2016-06-10 LAB — IPS PAP TEST WITH HPV

## 2016-06-10 LAB — TSH: TSH: 2.73 mIU/L

## 2016-06-11 ENCOUNTER — Encounter: Payer: Self-pay | Admitting: Obstetrics and Gynecology

## 2016-06-11 LAB — VITAMIN D 25 HYDROXY (VIT D DEFICIENCY, FRACTURES): VIT D 25 HYDROXY: 38 ng/mL (ref 30–100)

## 2016-06-14 DIAGNOSIS — F4320 Adjustment disorder, unspecified: Secondary | ICD-10-CM | POA: Diagnosis not present

## 2016-06-17 ENCOUNTER — Other Ambulatory Visit: Payer: BC Managed Care – PPO

## 2016-06-21 ENCOUNTER — Encounter: Payer: Self-pay | Admitting: Obstetrics and Gynecology

## 2016-06-22 DIAGNOSIS — E7800 Pure hypercholesterolemia, unspecified: Secondary | ICD-10-CM | POA: Diagnosis not present

## 2016-06-22 DIAGNOSIS — Z6827 Body mass index (BMI) 27.0-27.9, adult: Secondary | ICD-10-CM | POA: Diagnosis not present

## 2016-06-30 DIAGNOSIS — F4320 Adjustment disorder, unspecified: Secondary | ICD-10-CM | POA: Diagnosis not present

## 2016-07-15 DIAGNOSIS — F4320 Adjustment disorder, unspecified: Secondary | ICD-10-CM | POA: Diagnosis not present

## 2016-07-29 DIAGNOSIS — F4320 Adjustment disorder, unspecified: Secondary | ICD-10-CM | POA: Diagnosis not present

## 2016-09-23 DIAGNOSIS — F4320 Adjustment disorder, unspecified: Secondary | ICD-10-CM | POA: Diagnosis not present

## 2016-10-07 DIAGNOSIS — F4320 Adjustment disorder, unspecified: Secondary | ICD-10-CM | POA: Diagnosis not present

## 2016-10-21 DIAGNOSIS — F4320 Adjustment disorder, unspecified: Secondary | ICD-10-CM | POA: Diagnosis not present

## 2016-11-18 DIAGNOSIS — Z0000 Encounter for general adult medical examination without abnormal findings: Secondary | ICD-10-CM | POA: Diagnosis not present

## 2016-11-23 DIAGNOSIS — M2042 Other hammer toe(s) (acquired), left foot: Secondary | ICD-10-CM | POA: Diagnosis not present

## 2016-11-23 DIAGNOSIS — M2041 Other hammer toe(s) (acquired), right foot: Secondary | ICD-10-CM | POA: Diagnosis not present

## 2016-11-23 DIAGNOSIS — M720 Palmar fascial fibromatosis [Dupuytren]: Secondary | ICD-10-CM | POA: Diagnosis not present

## 2016-11-23 DIAGNOSIS — Z0001 Encounter for general adult medical examination with abnormal findings: Secondary | ICD-10-CM | POA: Diagnosis not present

## 2016-12-02 DIAGNOSIS — F4320 Adjustment disorder, unspecified: Secondary | ICD-10-CM | POA: Diagnosis not present

## 2016-12-14 DIAGNOSIS — Z1231 Encounter for screening mammogram for malignant neoplasm of breast: Secondary | ICD-10-CM | POA: Diagnosis not present

## 2016-12-16 DIAGNOSIS — F4320 Adjustment disorder, unspecified: Secondary | ICD-10-CM | POA: Diagnosis not present

## 2016-12-30 ENCOUNTER — Encounter: Payer: Self-pay | Admitting: Obstetrics and Gynecology

## 2016-12-30 DIAGNOSIS — F4320 Adjustment disorder, unspecified: Secondary | ICD-10-CM | POA: Diagnosis not present

## 2017-01-04 DIAGNOSIS — E7800 Pure hypercholesterolemia, unspecified: Secondary | ICD-10-CM | POA: Diagnosis not present

## 2017-01-05 ENCOUNTER — Encounter: Payer: Self-pay | Admitting: Obstetrics and Gynecology

## 2017-01-27 DIAGNOSIS — F4320 Adjustment disorder, unspecified: Secondary | ICD-10-CM | POA: Diagnosis not present

## 2017-02-17 DIAGNOSIS — F4320 Adjustment disorder, unspecified: Secondary | ICD-10-CM | POA: Diagnosis not present

## 2017-03-10 DIAGNOSIS — F4320 Adjustment disorder, unspecified: Secondary | ICD-10-CM | POA: Diagnosis not present

## 2017-03-16 ENCOUNTER — Encounter: Payer: Self-pay | Admitting: Obstetrics and Gynecology

## 2017-03-16 ENCOUNTER — Telehealth: Payer: Self-pay | Admitting: *Deleted

## 2017-03-16 NOTE — Telephone Encounter (Signed)
Telephone encounter created and routed to Dr. Quincy Simmonds. Closing encounter.

## 2017-03-16 NOTE — Telephone Encounter (Signed)
I would suggest Estroven which has black cohosh and soy in it.  Eating soy products may also help - tofu, soy beans, soy milk or ice cream.

## 2017-03-16 NOTE — Telephone Encounter (Signed)
MyChart message received from patient:   Non-Urgent Medical Question JAUNITA MIKELS Sent: Wed March 16, 2017 1:19 PM To: [facility]  Message   Hi, hope you are doing well. I was hoping you could give me some advice on what to take to see if I can control hot flashes. I haven't had a period since December so I think this is the real deal this time! The hot flashes aren't horrible, but not great either, and seem to be getting more frequent. (A few times at night and several times during the day.) What should I try over the counter before going to hormone treatment or a prescription? I think my regular appointment with you is in October. Thanks! Ebony Hail Schwinn  Routed to Dr. Quincy Simmonds for review.

## 2017-03-17 NOTE — Telephone Encounter (Signed)
I notified the patient of Dr. Elza Rafter recommendation. She will try Estroven and including more soy in her diet. She will see how she does between now and her appointment in October and discuss with Dr. Quincy Simmonds at that time. If she continues to have symptoms prior to her appointment she will contact the office.  Routing to provider for final review. Patient agreeable with disposition. Closing encounter.

## 2017-03-22 DIAGNOSIS — F4320 Adjustment disorder, unspecified: Secondary | ICD-10-CM | POA: Diagnosis not present

## 2017-04-13 DIAGNOSIS — F4320 Adjustment disorder, unspecified: Secondary | ICD-10-CM | POA: Diagnosis not present

## 2017-05-11 DIAGNOSIS — F4320 Adjustment disorder, unspecified: Secondary | ICD-10-CM | POA: Diagnosis not present

## 2017-06-20 ENCOUNTER — Encounter: Payer: Self-pay | Admitting: Obstetrics and Gynecology

## 2017-06-20 ENCOUNTER — Ambulatory Visit (INDEPENDENT_AMBULATORY_CARE_PROVIDER_SITE_OTHER): Payer: BC Managed Care – PPO | Admitting: Obstetrics and Gynecology

## 2017-06-20 VITALS — BP 130/80 | HR 56 | Resp 16 | Ht 62.0 in | Wt 163.0 lb

## 2017-06-20 DIAGNOSIS — Z01419 Encounter for gynecological examination (general) (routine) without abnormal findings: Secondary | ICD-10-CM | POA: Diagnosis not present

## 2017-06-20 DIAGNOSIS — L659 Nonscarring hair loss, unspecified: Secondary | ICD-10-CM | POA: Diagnosis not present

## 2017-06-20 MED ORDER — VALACYCLOVIR HCL 1 G PO TABS: ORAL_TABLET | ORAL | 11 refills | Status: DC

## 2017-06-20 NOTE — Progress Notes (Signed)
51 y.o. [id number] Married Caucasian female here for annual exam.    No menses for one year.  Having hot flashes.  Feels like her hot flashes are getting better but are still present.   Feels like she is loosing hair.   Had blood work with her PCP earlier this year and is now taking cholesterol medication.   PCP:  Deland Pretty, MD  Patient's last menstrual period was 06/08/2016 (approximate).           Sexually active: Yes.   female The current method of family planning is vasectomy.    Exercising: Yes.  biking   Smoker:  no  Health Maintenance: Pap: 06-08-16 Neg:Neg HR HPV,05/18/13 neg HPV HR neg History of abnormal Pap:  no MMG: 12-14-16 3D Density C/Neg/BiRads2:[facility] Colonoscopy: 03-14-15 Neg.with Dr.Mann;next due 02/2025 BMD:   n/a  Result  n/a TDaP:  2015 Gardasil:   no ZHY:QMVHQ years ago when got married? Hep C:years ago when got married? Screening Labs:  Hb today: PCP, Urine today: not done   reports that she has never smoked. She has never used smokeless tobacco. She reports that she drinks about 4.2 - 6.0 oz of alcohol per week . She reports that she does not use drugs.  Past Medical History:  Diagnosis Date  . Anemia   . Concussion    age 53  . Hip dysplasia   . Hyperlipidemia 2017  . Migraine     Past Surgical History:  Procedure Laterality Date  . HYSTEROSCOPY  03/2007   resection of polyp and D & C--Dr. Joan Flores  . SALPINGOSTOMY Right 10/1990   --ectopic    Current Outpatient Prescriptions  Medication Sig Dispense Refill  . calcium carbonate (TUMS - DOSED IN MG ELEMENTAL CALCIUM) 500 MG chewable tablet Chew 2 tablets by mouth daily.    Marland Kitchen ibuprofen (ADVIL,MOTRIN) 100 MG tablet Take 100 mg by mouth every 8 (eight) hours as needed for fever.    Marland Kitchen KRILL OIL PO Take 1 capsule by mouth daily.    . Misc Natural Products (ESTROVEN + ENERGY MAX STRENGTH PO) Take 1 tablet by mouth daily.    . Multiple Vitamin (MULTIVITAMIN) capsule Take 1 capsule by mouth daily.    .  rosuvastatin (CRESTOR) 10 MG tablet Take 1 tablet by mouth daily.  1  . valACYclovir (VALTREX) 1000 MG tablet Take 1 tablet (1,000mg ) q 12 hours for 24 hours. Then take 1 tablet (1,000mg ) daily for suppression. 30 tablet 11   No current facility-administered medications for this visit.     Family History  Problem Relation Age of Onset  . Migraines Mother   . Hypertension Mother   . Osteoarthritis Mother   . Hyperlipidemia Mother   . Melanoma Mother   . Hypertension Father   . Osteoarthritis Father   . Hyperlipidemia Father   . Cancer Father        oral cancer  . Migraines Sister   . Melanoma Sister   . Diabetes Sister     ROS:  Pertinent items are noted in HPI.  Otherwise, a comprehensive ROS was negative.  Exam:   BP 130/80 (BP Location: Right Arm, Patient Position: Sitting, Cuff Size: Normal)   Pulse (!) 56   Resp 16   Ht 5\' 2"  (1.575 m)   Wt 163 lb (73.9 kg)   LMP 06/08/2016 (Approximate)   BMI 29.81 kg/m     General appearance: alert, cooperative and appears stated age Head: Normocephalic, without obvious abnormality, atraumatic Neck:  no adenopathy, supple, symmetrical, trachea midline and thyroid normal to inspection and palpation Lungs: clear to auscultation bilaterally Breasts: normal appearance, no masses or tenderness, No nipple retraction or dimpling, No nipple discharge or bleeding, No axillary or supraclavicular adenopathy Heart: regular rate and rhythm Abdomen: soft, non-tender; no masses, no organomegaly Extremities: extremities normal, atraumatic, no cyanosis or edema Skin: Skin color, texture, turgor normal. No rashes or lesions Lymph nodes: Cervical, supraclavicular, and axillary nodes normal. No abnormal inguinal nodes palpated Neurologic: Grossly normal  Pelvic: External genitalia:  no lesions              Urethra:  normal appearing urethra with no masses, tenderness or lesions              Bartholins and Skenes: normal                 Vagina:  normal appearing vagina with normal color and discharge, no lesions.  First degree rectocele.               Cervix: no lesions              Pap taken: No. Bimanual Exam:  Uterus:  normal size, contour, position, consistency, mobility, non-tender              Adnexa: no mass, fullness, tenderness              Rectal exam: Yes.  .  Confirms.              Anus:  normal sphincter tone, no lesions  Chaperone was present for exam.  Assessment:   Well woman visit with normal exam. Postmenopausal .  Hx HSV.  Hyperlipidemia.  Hair loss.  Asymptomatic rectocele.   Plan: Mammogram screening discussed. Recommended self breast awareness. Pap and HR HPV as above. Guidelines for Calcium, Vitamin D, regular exercise program including cardiovascular and weight bearing exercise. Refill valtrex.  Check TSH, FSH, estradiol. We talked about potential visit to dermatology for hair loss if labs are normal.  We discussed her rectocele today and we will do observational management.  She will do Kegel's, avoid constipation, and call if she starts developing symptoms.  Follow up annually and prn.    After visit summary provided.

## 2017-06-20 NOTE — Patient Instructions (Signed)

## 2017-06-21 LAB — TSH: TSH: 2.5 u[IU]/mL (ref 0.450–4.500)

## 2017-06-21 LAB — ESTRADIOL: Estradiol: <5 pg/mL

## 2017-06-21 LAB — FOLLICLE STIMULATING HORMONE: FSH: 48 m[IU]/mL

## 2017-07-07 DIAGNOSIS — F4320 Adjustment disorder, unspecified: Secondary | ICD-10-CM | POA: Diagnosis not present

## 2017-07-28 DIAGNOSIS — L821 Other seborrheic keratosis: Secondary | ICD-10-CM | POA: Diagnosis not present

## 2017-07-28 DIAGNOSIS — D2261 Melanocytic nevi of right upper limb, including shoulder: Secondary | ICD-10-CM | POA: Diagnosis not present

## 2017-07-28 DIAGNOSIS — D2262 Melanocytic nevi of left upper limb, including shoulder: Secondary | ICD-10-CM | POA: Diagnosis not present

## 2017-07-28 DIAGNOSIS — L723 Sebaceous cyst: Secondary | ICD-10-CM | POA: Diagnosis not present

## 2017-08-17 DIAGNOSIS — F4320 Adjustment disorder, unspecified: Secondary | ICD-10-CM | POA: Diagnosis not present

## 2017-09-29 DIAGNOSIS — F4320 Adjustment disorder, unspecified: Secondary | ICD-10-CM | POA: Diagnosis not present

## 2017-11-10 DIAGNOSIS — F4320 Adjustment disorder, unspecified: Secondary | ICD-10-CM | POA: Diagnosis not present

## 2017-12-05 DIAGNOSIS — E7800 Pure hypercholesterolemia, unspecified: Secondary | ICD-10-CM | POA: Diagnosis not present

## 2017-12-05 DIAGNOSIS — N390 Urinary tract infection, site not specified: Secondary | ICD-10-CM | POA: Diagnosis not present

## 2017-12-05 DIAGNOSIS — Z0000 Encounter for general adult medical examination without abnormal findings: Secondary | ICD-10-CM | POA: Diagnosis not present

## 2017-12-05 DIAGNOSIS — E559 Vitamin D deficiency, unspecified: Secondary | ICD-10-CM | POA: Diagnosis not present

## 2017-12-08 DIAGNOSIS — Z0001 Encounter for general adult medical examination with abnormal findings: Secondary | ICD-10-CM | POA: Diagnosis not present

## 2017-12-08 DIAGNOSIS — M2041 Other hammer toe(s) (acquired), right foot: Secondary | ICD-10-CM | POA: Diagnosis not present

## 2017-12-08 DIAGNOSIS — N3000 Acute cystitis without hematuria: Secondary | ICD-10-CM | POA: Diagnosis not present

## 2017-12-08 DIAGNOSIS — M720 Palmar fascial fibromatosis [Dupuytren]: Secondary | ICD-10-CM | POA: Diagnosis not present

## 2017-12-22 DIAGNOSIS — F4320 Adjustment disorder, unspecified: Secondary | ICD-10-CM | POA: Diagnosis not present

## 2018-01-19 DIAGNOSIS — F4320 Adjustment disorder, unspecified: Secondary | ICD-10-CM | POA: Diagnosis not present

## 2018-03-21 DIAGNOSIS — F4320 Adjustment disorder, unspecified: Secondary | ICD-10-CM | POA: Diagnosis not present

## 2018-03-22 DIAGNOSIS — Z1231 Encounter for screening mammogram for malignant neoplasm of breast: Secondary | ICD-10-CM | POA: Diagnosis not present

## 2018-05-10 DIAGNOSIS — F4320 Adjustment disorder, unspecified: Secondary | ICD-10-CM | POA: Diagnosis not present

## 2018-06-29 ENCOUNTER — Ambulatory Visit: Payer: BC Managed Care – PPO | Admitting: Obstetrics and Gynecology

## 2018-06-29 NOTE — Progress Notes (Signed)
52 y.o. [id number] Married Caucasian female here for annual exam.    LMP 06/08/16. Patient complaining of vaginal spotting since 06-18-18. Feels like she had a really light menstruation.  Bleeding has stopped.  No pain other than mild cramps for one hour.   No internal vaginal treatments.  Intercourse did not proceed this.  Not taking any HRT.  Had hot flashes but stopped 2 months ago.  Taking Estroven.  Some leakage of urine with a recent URI.   Thinks she is having piriformis issues.  Not riding her bicycle and is now feeling better.  Not playing tennis anymore either.  Just sold their home and will be moving into a rental.   Labs with PCP.  PCP: Deland Pretty, MD    No LMP recorded.           Sexually active: Yes.   female The current method of family planning is vasectomy.    Exercising: Yes.    biking Smoker:  no  Health Maintenance: Pap: 06-08-16 Neg:Neg HR HPV,05/18/13 neg HPV HR neg History of abnormal Pap:  no MMG: 03-22-18 3D Neg/density C/biRads2 Colonoscopy: 03-14-15 Neg.with Dr.Mann;next due 02/2025 BMD:   n/a  Result  n/a TDaP:  2015 Gardasil:   no HIV: ?maybe years ago Hep C:?maybe years ago Screening Labs:  Hb today: PCP    reports that she has never smoked. She has never used smokeless tobacco. She reports that she drinks about 10.0 standard drinks of alcohol per week. She reports that she does not use drugs.  Past Medical History:  Diagnosis Date  . Anemia   . Concussion    age 54  . Hip dysplasia   . HSV-1 (herpes simplex virus 1) infection   . Hyperlipidemia 2017  . Migraine     Past Surgical History:  Procedure Laterality Date  . HYSTEROSCOPY  03/2007   resection of polyp and D & C--Dr. Joan Flores  . SALPINGOSTOMY Right 10/1990   --ectopic    Current Outpatient Medications  Medication Sig Dispense Refill  . calcium carbonate (TUMS - DOSED IN MG ELEMENTAL CALCIUM) 500 MG chewable tablet Chew 2 tablets by mouth daily.    Marland Kitchen ibuprofen  (ADVIL,MOTRIN) 100 MG tablet Take 100 mg by mouth every 8 (eight) hours as needed for fever.    . Misc Natural Products (ESTROVEN + ENERGY MAX STRENGTH PO) Take 1 tablet by mouth daily.    . Multiple Vitamin (MULTIVITAMIN) capsule Take 1 capsule by mouth daily.    . rosuvastatin (CRESTOR) 10 MG tablet Take 1 tablet by mouth daily.  1  . valACYclovir (VALTREX) 1000 MG tablet Take 1 tablet (1,000mg ) q 12 hours for 24 hours. Then take 1 tablet (1,000mg ) daily for suppression. 30 tablet 11   No current facility-administered medications for this visit.     Family History  Problem Relation Age of Onset  . Migraines Mother   . Hypertension Mother   . Osteoarthritis Mother   . Hyperlipidemia Mother   . Melanoma Mother   . Hypertension Father   . Osteoarthritis Father   . Hyperlipidemia Father   . Cancer Father        oral cancer  . Migraines Sister   . Melanoma Sister   . Diabetes Sister     Review of Systems  Genitourinary:       Loss of urine with coughing   All other systems reviewed and are negative.   Exam:   BP 128/78 (BP Location: Right Arm, Patient  Position: Sitting, Cuff Size: Normal)   Pulse 70   Resp 14   Ht 5' 2.5" (1.588 m)   Wt 162 lb 9.6 oz (73.8 kg)   BMI 29.27 kg/m     General appearance: alert, cooperative and appears stated age Head: Normocephalic, without obvious abnormality, atraumatic Neck: no adenopathy, supple, symmetrical, trachea midline and thyroid normal to inspection and palpation Lungs: clear to auscultation bilaterally Breasts: normal appearance, no masses or tenderness, No nipple retraction or dimpling, No nipple discharge or bleeding, No axillary or supraclavicular adenopathy Heart: regular rate and rhythm Abdomen: soft, non-tender; no masses, no organomegaly Extremities: extremities normal, atraumatic, no cyanosis or edema Skin: Skin color, texture, turgor normal. No rashes or lesions Lymph nodes: Cervical, supraclavicular, and axillary  nodes normal. No abnormal inguinal nodes palpated Neurologic: Grossly normal  Pelvic: External genitalia:  no lesions              Urethra:  normal appearing urethra with no masses, tenderness or lesions              Bartholins and Skenes: normal                 Vagina: normal appearing vagina with normal color and discharge, no lesions.  Brown menstrual blood.               Cervix: no lesions              Pap taken: Yes.   Bimanual Exam:  Uterus:  normal size, contour, position, consistency, mobility, non-tender              Adnexa: no mass, fullness, tenderness              Rectal exam: Yes.  .  Confirms.              Anus:  normal sphincter tone, no lesions  Chaperone was present for exam.  Assessment:   Well woman visit with normal exam. Postmenopausal bleeding.  Hx HSV 1.  Hyperlipidemia.  Hair loss.  Asymptomatic rectocele.   Plan: Mammogram screening. Recommended self breast awareness. Pap and HR HPV as above. Guidelines for Calcium, Vitamin D, regular exercise program including cardiovascular and weight bearing exercise. Discussed flu vaccine.  Return for pelvic US and EMB.   Discussed postmenopausal bleeding with the patient and potential etiologies. Refill of Valtrex.  Requests to have prescription as currently written.  Follow up annually and prn.  After visit summary provided.

## 2018-06-30 ENCOUNTER — Other Ambulatory Visit: Payer: Self-pay

## 2018-06-30 ENCOUNTER — Other Ambulatory Visit (HOSPITAL_COMMUNITY)
Admission: RE | Admit: 2018-06-30 | Discharge: 2018-06-30 | Disposition: A | Discharge status: Home / Self Care | Payer: BLUE CROSS/BLUE SHIELD | Source: Ambulatory Visit | Attending: Obstetrics and Gynecology | Admitting: Obstetrics and Gynecology

## 2018-06-30 ENCOUNTER — Encounter: Payer: Self-pay | Admitting: Obstetrics and Gynecology

## 2018-06-30 ENCOUNTER — Ambulatory Visit: Payer: BLUE CROSS/BLUE SHIELD | Admitting: Obstetrics and Gynecology

## 2018-06-30 VITALS — BP 128/78 | HR 70 | Resp 14 | Ht 62.5 in | Wt 162.6 lb

## 2018-06-30 DIAGNOSIS — Z01419 Encounter for gynecological examination (general) (routine) without abnormal findings: Secondary | ICD-10-CM | POA: Diagnosis not present

## 2018-06-30 DIAGNOSIS — N950 Postmenopausal bleeding: Secondary | ICD-10-CM | POA: Insufficient documentation

## 2018-06-30 MED ORDER — VALACYCLOVIR HCL 1 G PO TABS: ORAL_TABLET | ORAL | 11 refills | Status: DC

## 2018-06-30 NOTE — Patient Instructions (Signed)

## 2018-07-03 ENCOUNTER — Telehealth: Payer: Self-pay | Admitting: Obstetrics and Gynecology

## 2018-07-03 NOTE — Telephone Encounter (Signed)
Spoke with patient regarding benefit for ultrasound and endometrial biopsy. Patient understood and agreeable. Patient ready to schedule. Patient scheduled 07/13/18 with Dr Quincy Simmonds. Patient aware of appointment date, arrival time and cancellation policy.  No further questions.   Forwarding to Dr Quincy Simmonds for final review of appointment time frame. Patient is agreeable to disposition. Will close encounter

## 2018-07-04 LAB — CYTOLOGY - PAP
DIAGNOSIS: REACTIVE
Diagnosis: NEGATIVE
HPV (WINDOPATH): NOT DETECTED

## 2018-07-13 ENCOUNTER — Other Ambulatory Visit: Payer: Self-pay

## 2018-07-13 ENCOUNTER — Ambulatory Visit (INDEPENDENT_AMBULATORY_CARE_PROVIDER_SITE_OTHER): Payer: BLUE CROSS/BLUE SHIELD

## 2018-07-13 ENCOUNTER — Encounter: Payer: Self-pay | Admitting: Obstetrics and Gynecology

## 2018-07-13 ENCOUNTER — Ambulatory Visit: Payer: BLUE CROSS/BLUE SHIELD | Admitting: Obstetrics and Gynecology

## 2018-07-13 VITALS — BP 138/78 | HR 76 | Ht 62.5 in | Wt 162.8 lb

## 2018-07-13 DIAGNOSIS — D219 Benign neoplasm of connective and other soft tissue, unspecified: Secondary | ICD-10-CM | POA: Diagnosis not present

## 2018-07-13 DIAGNOSIS — N950 Postmenopausal bleeding: Secondary | ICD-10-CM | POA: Diagnosis not present

## 2018-07-13 DIAGNOSIS — R9389 Abnormal findings on diagnostic imaging of other specified body structures: Secondary | ICD-10-CM

## 2018-07-13 NOTE — Progress Notes (Signed)
GYNECOLOGY  VISIT   HPI: 52 y.o.   Married  Caucasian  female   [phone] with No LMP recorded.   here for pelvic ultrasound.   No further bleeding.   Wants to do further evaluation in December.   GYNECOLOGIC HISTORY: No LMP recorded. Contraception:  Vasectomy/postmenopausal Menopausal hormone therapy:  none Last mammogram:  03-22-18 3D Neg/density C/biRads2 Last pap smear: 06-30-18 Neg:Neg HR HPV                              06-08-16 Neg:Neg HR HPV        OB History    Gravida  5   Para  2   Term  2   Preterm      AB  3   Living  2     SAB  3   TAB      Ectopic      Multiple      Live Births                 There are no active problems to display for this patient.   Past Medical History:  Diagnosis Date  . Anemia   . Concussion    age 71  . Hip dysplasia   . HSV-1 (herpes simplex virus 1) infection   . Hyperlipidemia 2017  . Migraine     Past Surgical History:  Procedure Laterality Date  . HYSTEROSCOPY  03/2007   resection of polyp and D & C--Dr. Joan Flores  . SALPINGOSTOMY Right 10/1990   --ectopic    Current Outpatient Medications  Medication Sig Dispense Refill  . calcium carbonate (TUMS - DOSED IN MG ELEMENTAL CALCIUM) 500 MG chewable tablet Chew 2 tablets by mouth daily.    Marland Kitchen ibuprofen (ADVIL,MOTRIN) 100 MG tablet Take 100 mg by mouth every 8 (eight) hours as needed for fever.    . Misc Natural Products (ESTROVEN + ENERGY MAX STRENGTH PO) Take 1 tablet by mouth daily.    . Multiple Vitamin (MULTIVITAMIN) capsule Take 1 capsule by mouth daily.    . rosuvastatin (CRESTOR) 10 MG tablet Take 1 tablet by mouth daily.  1  . valACYclovir (VALTREX) 1000 MG tablet Take 1 tablet (1,000mg ) q 12 hours for 24 hours. Then take 1 tablet (1,000mg ) daily for suppression. 30 tablet 11   No current facility-administered medications for this visit.      ALLERGIES: Doxycycline and Bactrim [sulfamethoxazole-trimethoprim]  Family History  Problem Relation Age  of Onset  . Migraines Mother   . Hypertension Mother   . Osteoarthritis Mother   . Hyperlipidemia Mother   . Melanoma Mother   . Hypertension Father   . Osteoarthritis Father   . Hyperlipidemia Father   . Cancer Father        oral cancer  . Migraines Sister   . Melanoma Sister   . Diabetes Sister     Social History   Socioeconomic History  . Marital status: Married    Spouse name: Not on file  . Number of children: Not on file  . Years of education: Not on file  . Highest education level: Not on file  Occupational History  . Not on file  Social Needs  . Financial resource strain: Not on file  . Food insecurity:    Worry: Not on file    Inability: Not on file  . Transportation needs:    Medical: Not on file  Non-medical: Not on file  Tobacco Use  . Smoking status: Never Smoker  . Smokeless tobacco: Never Used  Substance and Sexual Activity  . Alcohol use: Yes    Alcohol/week: 10.0 standard drinks    Types: 10 Standard drinks or equivalent per week  . Drug use: No  . Sexual activity: Yes    Partners: Male    Birth control/protection: Other-see comments    Comment: vasectomy  Lifestyle  . Physical activity:    Days per week: Not on file    Minutes per session: Not on file  . Stress: Not on file  Relationships  . Social connections:    Talks on phone: Not on file    Gets together: Not on file    Attends religious service: Not on file    Active member of club or organization: Not on file    Attends meetings of clubs or organizations: Not on file    Relationship status: Not on file  . Intimate partner violence:    Fear of current or ex partner: Not on file    Emotionally abused: Not on file    Physically abused: Not on file    Forced sexual activity: Not on file  Other Topics Concern  . Not on file  Social History Narrative  . Not on file    Review of Systems  All other systems reviewed and are negative.   PHYSICAL EXAMINATION:    BP 138/78 (BP  Location: Right Arm, Patient Position: Sitting, Cuff Size: Normal)   Pulse 76   Ht 5' 2.5" (1.588 m)   Wt 162 lb 12.8 oz (73.8 kg)   BMI 29.30 kg/m     General appearance: alert, cooperative and appears stated age   ASSESSMENT   Postmenopausal bleeding.  Endometrial mass.  Fibroids.   PLAN  We discussed postmenopausal bleeding and potential etiologies.  She reviewed EMB versus sonohysterogram and potential EMB.  We also discussed the possibility of hysteroscopy with dilation and curettage for future.   An After Visit Summary was printed and given to the patient.  __15____ minutes face to face time of which over 50% was spent in counseling.

## 2018-08-10 ENCOUNTER — Ambulatory Visit (INDEPENDENT_AMBULATORY_CARE_PROVIDER_SITE_OTHER): Payer: BLUE CROSS/BLUE SHIELD

## 2018-08-10 ENCOUNTER — Telehealth: Payer: Self-pay | Admitting: Obstetrics and Gynecology

## 2018-08-10 ENCOUNTER — Encounter: Payer: Self-pay | Admitting: Obstetrics and Gynecology

## 2018-08-10 ENCOUNTER — Ambulatory Visit: Payer: BLUE CROSS/BLUE SHIELD | Admitting: Obstetrics and Gynecology

## 2018-08-10 ENCOUNTER — Other Ambulatory Visit: Payer: Self-pay

## 2018-08-10 VITALS — BP 148/80 | HR 70 | Ht 62.5 in | Wt 162.0 lb

## 2018-08-10 DIAGNOSIS — N9489 Other specified conditions associated with female genital organs and menstrual cycle: Secondary | ICD-10-CM

## 2018-08-10 DIAGNOSIS — R9389 Abnormal findings on diagnostic imaging of other specified body structures: Secondary | ICD-10-CM

## 2018-08-10 DIAGNOSIS — N950 Postmenopausal bleeding: Secondary | ICD-10-CM

## 2018-08-10 NOTE — Telephone Encounter (Signed)
Call placed to convey surgery benefits.

## 2018-08-10 NOTE — Progress Notes (Signed)
GYNECOLOGY  VISIT   HPI: 52 y.o.   Married  Caucasian  female   [phone] with Patient's last menstrual period was 05/30/2016 (approximate).   here for sonohysterogram.  Had postmenopausal bleeding and pelvic US showing potential mass 6 x 5 mm on 07/13/18.  She had several known fibroids - subserosal and intramural with largest being 32 mm.  Her ovaries were normal.  She declined a biopsy on the date of her ultrasound and preferred to proceed with sonohysterogram.  States she has had two prior dilation and curettage procedures, one following a miscarriage and one for polyps.   GYNECOLOGIC HISTORY: Patient's last menstrual period was 05/30/2016 (approximate). Contraception: Vasectomy/postmenopausal Menopausal hormone therapy:  none Last mammogram: 03-22-18 3D Neg/density C/biRads2 Last pap smear:  06-30-18 Neg:Neg HR HPV                              06-08-16 Neg:Neg HR HPV        OB History    Gravida  5   Para  2   Term  2   Preterm      AB  3   Living  2     SAB  3   TAB      Ectopic      Multiple      Live Births                 Patient Active Problem List   Diagnosis Date Noted  . Postmenopausal bleeding 07/13/2018    Past Medical History:  Diagnosis Date  . Anemia   . Concussion    age 64  . Hip dysplasia   . HSV-1 (herpes simplex virus 1) infection   . Hyperlipidemia 2017  . Migraine     Past Surgical History:  Procedure Laterality Date  . HYSTEROSCOPY  03/2007   resection of polyp and D & C--Dr. Joan Flores  . SALPINGOSTOMY Right 10/1990   --ectopic    Current Outpatient Medications  Medication Sig Dispense Refill  . calcium carbonate (TUMS - DOSED IN MG ELEMENTAL CALCIUM) 500 MG chewable tablet Chew 2 tablets by mouth daily.    Marland Kitchen ibuprofen (ADVIL,MOTRIN) 100 MG tablet Take 100 mg by mouth every 8 (eight) hours as needed for fever.    . Misc Natural Products (ESTROVEN + ENERGY MAX STRENGTH PO) Take 1 tablet by mouth daily.    . Multiple Vitamin  (MULTIVITAMIN) capsule Take 1 capsule by mouth daily.    . rosuvastatin (CRESTOR) 10 MG tablet Take 1 tablet by mouth daily.  1  . valACYclovir (VALTREX) 1000 MG tablet Take 1 tablet (1,000mg ) q 12 hours for 24 hours. Then take 1 tablet (1,000mg ) daily for suppression. 30 tablet 11   No current facility-administered medications for this visit.      ALLERGIES: Doxycycline and Bactrim [sulfamethoxazole-trimethoprim]  Family History  Problem Relation Age of Onset  . Migraines Mother   . Hypertension Mother   . Osteoarthritis Mother   . Hyperlipidemia Mother   . Melanoma Mother   . Hypertension Father   . Osteoarthritis Father   . Hyperlipidemia Father   . Cancer Father        oral cancer  . Migraines Sister   . Melanoma Sister   . Diabetes Sister     Social History   Socioeconomic History  . Marital status: Married    Spouse name: Not on file  . Number of children:  Not on file  . Years of education: Not on file  . Highest education level: Not on file  Occupational History  . Not on file  Social Needs  . Financial resource strain: Not on file  . Food insecurity:    Worry: Not on file    Inability: Not on file  . Transportation needs:    Medical: Not on file    Non-medical: Not on file  Tobacco Use  . Smoking status: Never Smoker  . Smokeless tobacco: Never Used  Substance and Sexual Activity  . Alcohol use: Yes    Alcohol/week: 10.0 standard drinks    Types: 10 Standard drinks or equivalent per week  . Drug use: No  . Sexual activity: Yes    Partners: Male    Birth control/protection: Other-see comments    Comment: vasectomy  Lifestyle  . Physical activity:    Days per week: Not on file    Minutes per session: Not on file  . Stress: Not on file  Relationships  . Social connections:    Talks on phone: Not on file    Gets together: Not on file    Attends religious service: Not on file    Active member of club or organization: Not on file    Attends  meetings of clubs or organizations: Not on file    Relationship status: Not on file  . Intimate partner violence:    Fear of current or ex partner: Not on file    Emotionally abused: Not on file    Physically abused: Not on file    Forced sexual activity: Not on file  Other Topics Concern  . Not on file  Social History Narrative  . Not on file    Review of Systems  All other systems reviewed and are negative.   PHYSICAL EXAMINATION:    BP (!) 148/80 (BP Location: Right Arm, Patient Position: Sitting, Cuff Size: Normal)   Pulse 70   Ht 5' 2.5" (1.588 m)   Wt 162 lb (73.5 kg)   LMP 05/30/2016 (Approximate)   BMI 29.16 kg/m     General appearance: alert, cooperative and appears stated age   Pelvic US: Multiple fibroids. EMS 5.68 mm. Ovaries normal.   Sonohysterogram: Sterile prep of cervix with Hibiclens. Cannula placed and sterile saline injected.  2 filling defects noted - 5 mm at fundus and 3 mm at left lower uterine segment.   Chaperone was present for exam.  ASSESSMENT  Postmenopausal bleeding.  Endometrial masses.  PLAN  Patient declines EMB after discussing risks and benefits.  Discussion of postmenopausal bleeding and endometrial masses.. Discussion of hysteroscopy with Myosure polypectomy, dilation and curettage.  Risks, benefits, and alternatives reviewed. Risks include but are not limited to bleeding, infection, damage to surrounding organs including uterine perforation requiring hospitalization and laparoscopy, reaction to anesthesia, DVT, PE, death, need for further treatment and surgery including hysterectomy or medical therapy.   The possibility of negative findings also reviewed. Surgical expectations and recovery discussed.  Patient wishes to proceed.   An After Visit Summary was printed and given to the patient.  ___25___ minutes face to face time of which over 50% was spent in counseling.

## 2018-08-11 NOTE — Telephone Encounter (Signed)
Call to patient. Reviewed surgery date options. Patient desires to proceed om 08-29-18. Advised will schedule and call back once confirmed.

## 2018-08-11 NOTE — Telephone Encounter (Signed)
Call to patient. Per ROI, can leave message on voice mail which has name confirmation. Left message confirming surgery date of 08-29-18 at 0915. Call back to review surgery instructions.

## 2018-08-13 DIAGNOSIS — N9489 Other specified conditions associated with female genital organs and menstrual cycle: Secondary | ICD-10-CM | POA: Insufficient documentation

## 2018-08-16 ENCOUNTER — Telehealth: Payer: Self-pay | Admitting: Obstetrics and Gynecology

## 2018-08-16 NOTE — Telephone Encounter (Signed)
Call to patient to review Surgery information form. Patient states she had called this morning because she would like to reschedule her surgery to 2020. Asking for a call back for available dates. RN advised would send message to Gay Filler who schedules surgeries. Patient agreeable.

## 2018-08-16 NOTE — Telephone Encounter (Signed)
Spoke with patient. Patient will keep surgery as scheduled for 08-29-18. Surgery information form reviewed in detail and she verbalized understanding. Patient aware a copy will be mailed to her. 2 week post op appointment scheduled for 09-13-18 at 1000. Patient agreeable to date and time of appointment. Phone call transferred to Lerry Liner to collect surgery payment.

## 2018-08-16 NOTE — Telephone Encounter (Signed)
Call placed to patient to discuss surgery cancellation. In speaking with the patient, she has a misunderstanding that her insurance benefits were calendar year.  I reviewed benefits with patient and explained her insurance benefits began on 05/30/18 and ran in a plan year (05/30/18 through 05/30/2019). Reviewed benefits, patient understood information presented. Patient aware this is professional benefit only. Patient has decided to leave surgery date has scheduled on 08/29/18.  Call transferred to [facility] to reviewed preoperative instructions   Forwarding to Dr Quincy Simmonds for final review. Patient is agreeable to disposition. Will close encounter   cc: Lamont Snowball, RN

## 2018-08-16 NOTE — Telephone Encounter (Signed)
Patient called to cancel her surgery for 08/29/18 due to new insurance. Patient also received a call from the hospital that she needs additional blood work and she has some questions about the necessity of that.

## 2018-08-16 NOTE — Telephone Encounter (Signed)
Opened in error

## 2018-08-17 ENCOUNTER — Telehealth: Payer: Self-pay | Admitting: *Deleted

## 2018-08-17 ENCOUNTER — Encounter (HOSPITAL_COMMUNITY): Payer: Self-pay

## 2018-08-17 DIAGNOSIS — F4320 Adjustment disorder, unspecified: Secondary | ICD-10-CM | POA: Diagnosis not present

## 2018-08-17 NOTE — Patient Instructions (Addendum)
Allison Crawford  1965-11-19    Your procedure is scheduled on:  08-29-2018    Report to [facility] Main  Entrance,  Report to admitting at  7:30 AM    Call this number if you have problems the morning of surgery [phone]       Remember:   NO SOLID FOOD AFTER MIDNIGHT THE NIGHT PRIOR TO SURGERY. NOTHING BY MOUTH EXCEPT CLEAR LIQUIDS UNTIL 3 HOURS PRIOR TO [place] SURGERY. PLEASE FINISH ENSURE DRINK PER SURGEON ORDER 3 HOURS PRIOR TO SCHEDULED SURGERY TIME WHICH NEEDS TO BE COMPLETED AT __6:30 AM___.   AFTER 4:30 AM NOTHING BY MOUTH INCLUDING WATER, CANDY, GUM, MINTS.                                     BRUSH YOUR TEETH MORNING OF SURGERY AND RINSE YOUR MOUTH OUT.     Take these medicines the morning of surgery with A SIP OF WATER:    NONE                                  You may not have any metal on your body including hair pins and               piercings  Do not wear jewelry, make-up, lotions, powders or perfumes, deodorant              Do not wear nail polish.  Do not shave  48 hours prior to surgery.                    Do not bring valuables to the hospital. [place].  Contacts, dentures or bridgework may not be worn into surgery.  Leave suitcase in the car. After surgery it may be brought to your room.      Patients discharged the day of surgery will not be allowed to drive home.  Name and phone number of your driver:                 _____________________________________________________________________      CLEAR LIQUID DIET   Foods Allowed                                                                     Foods Excluded  Coffee and tea, regular and decaf                             liquids that you cannot  Plain Jell-O in any flavor                                             see through such as: Fruit ices (not with fruit pulp)  milk, soups, orange juice  Iced Popsicles                                    All solid food Carbonated beverages, regular and diet                                    Cranberry, grape and apple juices Sports drinks like Gatorade Lightly seasoned clear broth or consume(fat free) Sugar, honey syrup  Sample Menu Breakfast                                Lunch                                     Supper Cranberry juice                    Beef broth                            Chicken broth Jell-O                                     Grape juice                           Apple juice Coffee or tea                        Jell-O                                      Popsicle                                                Coffee or tea                        Coffee or tea  _____________________________________________________________________            [facility] - Preparing for Surgery Before surgery, you can play an important role.  Because skin is not sterile, your skin needs to be as free of germs as possible.  You can reduce the number of germs on your skin by washing with CHG (chlorahexidine gluconate) soap before surgery.  CHG is an antiseptic cleaner which kills germs and bonds with the skin to continue killing germs even after washing. Please DO NOT use if you have an allergy to CHG or antibacterial soaps.  If your skin becomes reddened/irritated stop using the CHG and inform your nurse when you arrive at Short Stay. Do not shave (including legs and underarms) for at least 48 hours prior to the first CHG shower.  You may shave your face/neck. Please follow these instructions carefully:  1.  Shower with CHG Soap the night before surgery and the  morning  of Surgery.  2.  If you choose to wash your hair, wash your hair first as usual with your  normal  shampoo.  3.  After you shampoo, rinse your hair and body thoroughly to remove the  shampoo.                            4.  Use CHG as you would  any other liquid soap.  You can apply chg directly  to the skin and wash                       Gently with a scrungie or clean washcloth.  5.  Apply the CHG Soap to your body ONLY FROM THE NECK DOWN.   Do not use on face/ open                           Wound or open sores. Avoid contact with eyes, ears mouth and genitals (private parts).                       Wash face,  Genitals (private parts) with your normal soap.             6.  Wash thoroughly, paying special attention to the area where your surgery  will be performed.  7.  Thoroughly rinse your body with warm water from the neck down.  8.  DO NOT shower/wash with your normal soap after using and rinsing off  the CHG Soap.             9.  Pat yourself dry with a clean towel.            10.  Wear clean pajamas.            11.  Place clean sheets on your bed the night of your first shower and do not  sleep with pets. Day of Surgery : Do not apply any lotions/deodorants the morning of surgery.  Please wear clean clothes to the hospital/surgery center.  FAILURE TO FOLLOW THESE INSTRUCTIONS MAY RESULT IN THE CANCELLATION OF YOUR SURGERY PATIENT SIGNATURE_________________________________  NURSE SIGNATURE__________________________________  ________________________________________________________________________

## 2018-08-17 NOTE — Telephone Encounter (Signed)
Message received from [facility] Pre-Surgical testing. Patient reported to pharmacy services that she was cancelling surgical procedure.  Pre-services center calling to confirm since she is scheduled for appointment with them tomorrow morning. Call to patient. Per ROI, can leave message on voice mail. Number confirmed on phone. Left message need to confirm surgical plans first thing in AM.

## 2018-08-18 ENCOUNTER — Encounter (HOSPITAL_COMMUNITY)
Admission: RE | Admit: 2018-08-18 | Discharge: 2018-08-18 | Disposition: A | Discharge status: Home / Self Care | Payer: BLUE CROSS/BLUE SHIELD | Source: Ambulatory Visit | Attending: Obstetrics and Gynecology | Admitting: Obstetrics and Gynecology

## 2018-08-18 ENCOUNTER — Encounter (HOSPITAL_COMMUNITY): Payer: Self-pay

## 2018-08-18 ENCOUNTER — Other Ambulatory Visit: Payer: Self-pay

## 2018-08-18 DIAGNOSIS — Z01812 Encounter for preprocedural laboratory examination: Secondary | ICD-10-CM | POA: Diagnosis not present

## 2018-08-18 DIAGNOSIS — N950 Postmenopausal bleeding: Secondary | ICD-10-CM | POA: Diagnosis not present

## 2018-08-18 HISTORY — DX: Personal history of traumatic brain injury: Z87.820

## 2018-08-18 HISTORY — DX: Presence of spectacles and contact lenses: Z97.3

## 2018-08-18 HISTORY — DX: Personal history of other complications of pregnancy, childbirth and the puerperium: Z87.59

## 2018-08-18 HISTORY — DX: Iron deficiency anemia, unspecified: D50.9

## 2018-08-18 HISTORY — DX: Other specified conditions associated with female genital organs and menstrual cycle: N94.89

## 2018-08-18 HISTORY — DX: Postmenopausal bleeding: N95.0

## 2018-08-18 LAB — CBC
HCT: 39.6 % (ref 36.0–46.0)
Hemoglobin: 13.4 g/dL (ref 12.0–15.0)
MCH: 31.1 pg (ref 26.0–34.0)
MCHC: 33.8 g/dL (ref 30.0–36.0)
MCV: 91.9 fL (ref 80.0–100.0)
Platelets: 252 10*3/uL (ref 150–400)
RBC: 4.31 MIL/uL (ref 3.87–5.11)
RDW: 12.7 % (ref 11.5–15.5)
WBC: 7.5 10*3/uL (ref 4.0–10.5)
nRBC: 0 % (ref 0.0–0.2)

## 2018-08-18 NOTE — Telephone Encounter (Signed)
Patient confirms she is planning to proceed with surgery as scheduled. This message must have crossed over from prior to clarification of insurance benefits. She is on her way to pre-op appointment at hospital now.   Encounter closed.

## 2018-08-18 NOTE — Telephone Encounter (Signed)
Patient is returning a call to [place]. She states that she does not want to cancel her surgical procedure.

## 2018-08-29 ENCOUNTER — Encounter (HOSPITAL_COMMUNITY): Payer: Self-pay | Admitting: Registered Nurse

## 2018-08-29 ENCOUNTER — Ambulatory Visit (HOSPITAL_COMMUNITY): Payer: BLUE CROSS/BLUE SHIELD | Admitting: Registered Nurse

## 2018-08-29 ENCOUNTER — Ambulatory Visit (HOSPITAL_COMMUNITY)
Admission: RE | Admit: 2018-08-29 | Discharge: 2018-08-29 | Disposition: A | Discharge status: Home / Self Care | Payer: BLUE CROSS/BLUE SHIELD | Attending: Obstetrics and Gynecology | Admitting: Obstetrics and Gynecology

## 2018-08-29 ENCOUNTER — Encounter (HOSPITAL_COMMUNITY)
Admission: RE | Disposition: A | Discharge status: Home / Self Care | Payer: Self-pay | Source: Home / Self Care | Attending: Obstetrics and Gynecology

## 2018-08-29 DIAGNOSIS — N9489 Other specified conditions associated with female genital organs and menstrual cycle: Secondary | ICD-10-CM

## 2018-08-29 DIAGNOSIS — N840 Polyp of corpus uteri: Secondary | ICD-10-CM | POA: Insufficient documentation

## 2018-08-29 DIAGNOSIS — Z79899 Other long term (current) drug therapy: Secondary | ICD-10-CM | POA: Insufficient documentation

## 2018-08-29 DIAGNOSIS — E785 Hyperlipidemia, unspecified: Secondary | ICD-10-CM | POA: Diagnosis not present

## 2018-08-29 DIAGNOSIS — N950 Postmenopausal bleeding: Secondary | ICD-10-CM | POA: Diagnosis not present

## 2018-08-29 DIAGNOSIS — Z881 Allergy status to other antibiotic agents status: Secondary | ICD-10-CM | POA: Insufficient documentation

## 2018-08-29 DIAGNOSIS — N858 Other specified noninflammatory disorders of uterus: Secondary | ICD-10-CM | POA: Diagnosis not present

## 2018-08-29 HISTORY — PX: DILATATION & CURETTAGE/HYSTEROSCOPY WITH MYOSURE: SHX6511

## 2018-08-29 SURGERY — DILATATION & CURETTAGE/HYSTEROSCOPY WITH MYOSURE
Anesthesia: General

## 2018-08-29 MED ORDER — FENTANYL CITRATE (PF) 100 MCG/2ML IJ SOLN
INTRAMUSCULAR | Status: AC
  Filled 2018-08-29: qty 4

## 2018-08-29 MED ORDER — OXYCODONE HCL 5 MG PO TABS: 5.0000 mg | ORAL_TABLET | Freq: Once | ORAL | Status: DC | PRN

## 2018-08-29 MED ORDER — FENTANYL CITRATE (PF) 100 MCG/2ML IJ SOLN
INTRAMUSCULAR | Status: DC | PRN
  Administered 2018-08-29 (×2): 50 ug via INTRAVENOUS

## 2018-08-29 MED ORDER — KETOROLAC TROMETHAMINE 30 MG/ML IJ SOLN
INTRAMUSCULAR | Status: AC
  Filled 2018-08-29: qty 1

## 2018-08-29 MED ORDER — LIDOCAINE HCL (PF) 1 % IJ SOLN
INTRAMUSCULAR | Status: AC
  Filled 2018-08-29: qty 30

## 2018-08-29 MED ORDER — LIDOCAINE 2% (20 MG/ML) 5 ML SYRINGE
INTRAMUSCULAR | Status: DC | PRN
  Administered 2018-08-29: 60 mg via INTRAVENOUS

## 2018-08-29 MED ORDER — LIDOCAINE 2% (20 MG/ML) 5 ML SYRINGE
INTRAMUSCULAR | Status: AC
  Filled 2018-08-29: qty 5

## 2018-08-29 MED ORDER — OXYCODONE HCL 5 MG/5ML PO SOLN
5.0000 mg | Freq: Once | ORAL | Status: DC | PRN
  Filled 2018-08-29: qty 5

## 2018-08-29 MED ORDER — ONDANSETRON HCL 4 MG/2ML IJ SOLN
INTRAMUSCULAR | Status: AC
  Filled 2018-08-29: qty 2

## 2018-08-29 MED ORDER — LIDOCAINE HCL (PF) 1 % IJ SOLN
INTRAMUSCULAR | Status: DC | PRN
  Administered 2018-08-29: 10 mL

## 2018-08-29 MED ORDER — PROPOFOL 10 MG/ML IV BOLUS
INTRAVENOUS | Status: DC | PRN
  Administered 2018-08-29: 180 mg via INTRAVENOUS

## 2018-08-29 MED ORDER — MIDAZOLAM HCL 2 MG/2ML IJ SOLN
INTRAMUSCULAR | Status: AC
  Filled 2018-08-29: qty 2

## 2018-08-29 MED ORDER — DEXAMETHASONE SODIUM PHOSPHATE 10 MG/ML IJ SOLN
INTRAMUSCULAR | Status: DC | PRN
  Administered 2018-08-29: 10 mg via INTRAVENOUS

## 2018-08-29 MED ORDER — DEXAMETHASONE SODIUM PHOSPHATE 10 MG/ML IJ SOLN
INTRAMUSCULAR | Status: AC
  Filled 2018-08-29: qty 1

## 2018-08-29 MED ORDER — MIDAZOLAM HCL 5 MG/5ML IJ SOLN
INTRAMUSCULAR | Status: DC | PRN
  Administered 2018-08-29: 2 mg via INTRAVENOUS

## 2018-08-29 MED ORDER — ONDANSETRON HCL 4 MG/2ML IJ SOLN
INTRAMUSCULAR | Status: DC | PRN
  Administered 2018-08-29: 4 mg via INTRAVENOUS

## 2018-08-29 MED ORDER — IBUPROFEN 800 MG PO TABS: 800.0000 mg | ORAL_TABLET | Freq: Three times a day (TID) | ORAL | 0 refills | Status: AC | PRN

## 2018-08-29 MED ORDER — FENTANYL CITRATE (PF) 100 MCG/2ML IJ SOLN
25.0000 ug | INTRAMUSCULAR | Status: DC | PRN
  Administered 2018-08-29: 50 ug via INTRAVENOUS

## 2018-08-29 MED ORDER — SODIUM CHLORIDE 0.9 % IR SOLN
Status: DC | PRN
  Administered 2018-08-29: 3000 mL

## 2018-08-29 MED ORDER — FENTANYL CITRATE (PF) 100 MCG/2ML IJ SOLN
INTRAMUSCULAR | Status: AC
  Filled 2018-08-29: qty 2

## 2018-08-29 MED ORDER — KETOROLAC TROMETHAMINE 30 MG/ML IJ SOLN
INTRAMUSCULAR | Status: DC | PRN
  Administered 2018-08-29: 30 mg via INTRAVENOUS

## 2018-08-29 MED ORDER — ONDANSETRON HCL 4 MG/2ML IJ SOLN: 4.0000 mg | Freq: Four times a day (QID) | INTRAMUSCULAR | Status: DC | PRN

## 2018-08-29 MED ORDER — PROPOFOL 10 MG/ML IV BOLUS
INTRAVENOUS | Status: AC
  Filled 2018-08-29: qty 20

## 2018-08-29 MED ORDER — LACTATED RINGERS IV SOLN
INTRAVENOUS | Status: DC
  Administered 2018-08-29: 08:00:00 via INTRAVENOUS

## 2018-08-29 SURGICAL SUPPLY — 20 items
CATH ROBINSON RED A/P 16FR (CATHETERS) ×2 IMPLANT
COVER WAND RF STERILE (DRAPES) IMPLANT
DEVICE MYOSURE LITE (MISCELLANEOUS) ×2 IMPLANT
DRAPE SHEET LG 3/4 BI-LAMINATE (DRAPES) ×4 IMPLANT
DRSG TELFA 3X8 NADH (GAUZE/BANDAGES/DRESSINGS) IMPLANT
GAUZE 4X4 16PLY RFD (DISPOSABLE) ×2 IMPLANT
GLOVE BIO SURGEON STRL SZ 6 (GLOVE) ×8 IMPLANT
GOWN STRL REUS W/ TWL LRG LVL3 (GOWN DISPOSABLE) ×2 IMPLANT
GOWN STRL REUS W/TWL LRG LVL3 (GOWN DISPOSABLE) ×2
KIT BASIN OR (CUSTOM PROCEDURE TRAY) ×2 IMPLANT
KIT PROCEDURE FLUENT (KITS) ×2 IMPLANT
NS IRRIG 1000ML POUR BTL (IV SOLUTION) ×2 IMPLANT
PACK LITHOTOMY IV (CUSTOM PROCEDURE TRAY) IMPLANT
PAD OB MATERNITY 4.3X12.25 (PERSONAL CARE ITEMS) ×2 IMPLANT
SURGILUBE 2OZ TUBE FLIPTOP (MISCELLANEOUS) IMPLANT
SUT VIC AB 0 CT1 27 (SUTURE)
SUT VIC AB 0 CT1 27XBRD ANTBC (SUTURE) IMPLANT
TOWEL OR 17X26 10 PK STRL BLUE (TOWEL DISPOSABLE) ×2 IMPLANT
UNDERPAD 30X30 (UNDERPADS AND DIAPERS) IMPLANT
YANKAUER SUCT BULB TIP 10FT TU (MISCELLANEOUS) IMPLANT

## 2018-08-29 NOTE — Anesthesia Procedure Notes (Signed)
Procedure Name: LMA Insertion Date/Time: 08/29/2018 9:14 AM Performed by: Talbot Grumbling, CRNA Pre-anesthesia Checklist: Patient identified, Emergency Drugs available, Suction available and Patient being monitored Patient Re-evaluated:Patient Re-evaluated prior to induction Oxygen Delivery Method: Circle system utilized Preoxygenation: Pre-oxygenation with 100% oxygen Induction Type: IV induction Ventilation: Mask ventilation without difficulty LMA: LMA inserted LMA Size: 4.0 Number of attempts: 1 Placement Confirmation: positive ETCO2 and breath sounds checked- equal and bilateral Tube secured with: Tape Dental Injury: Teeth and Oropharynx as per pre-operative assessment

## 2018-08-29 NOTE — Transfer of Care (Signed)
Immediate Anesthesia Transfer of Care Note  Patient: Allison Crawford  Procedure(s) Performed: DILATATION & CURETTAGE/HYSTEROSCOPY WITH MYOSURE (N/A )  Patient Location: PACU  Anesthesia Type:General   Level of Consciousness: awake, alert  and oriented  Airway & Oxygen Therapy: Patient Spontanous Breathing and Patient connected to face mask oxygen  Post-op Assessment: Report given to RN and Post -op Vital signs reviewed and stable  Post vital signs: Reviewed and stable  Last Vitals:  Vitals Value Taken Time  BP    Temp    Pulse    Resp    SpO2      Last Pain:  Vitals:   08/29/18 0817  TempSrc:   PainSc: 0-No pain         Complications: No apparent anesthesia complications

## 2018-08-29 NOTE — H&P (Signed)
Office Visit   08/10/2018 [facility]    Allison Crawford, Allison All, MD  Obstetrics and Gynecology   Postmenopausal bleeding +1 more  Dx   Sonohysterogram   ; Referred by Allison Pretty, MD  Reason for Visit   Additional Documentation   Vitals:   BP 148/80 (BP Location: Right Arm, Patient Position: Sitting, Cuff Size: Normal)   Pulse 70   Ht 5' 2.5" (1.588 m)   Wt 73.5 kg   LMP 05/30/2016 (Approximate)   BMI 29.16 kg/m   BSA 1.8 m     More Vitals   Flowsheets:   MEWS Score,   Anthropometrics     Encounter Info:   Billing Info,   History,   Allergies,   Detailed Report     [place]rawford Notes   Progress Notes by Nunzio Cobbs, MD at 08/10/2018 9:30 AM  Author: Nunzio Cobbs, MD Author Type: Physician Filed: 08/13/2018 10:00 PM  Note Status: Signed Cosign: Cosign Not Required Encounter Date: 08/10/2018  Editor: Nunzio Cobbs, MD (Physician)  Prior Versions: 1. Lowella Fairy, CMA (Certified Psychologist, sport and exercise) at 08/10/2018 9:57 AM - Sign when Signing Visit   2. Orion Crook, [place] (Technician) at 08/10/2018 9:48 AM - Sign when Signing Visit    GYNECOLOGY  VISIT   HPI: 52 y.o.   Married  Caucasian  female   [phone] with Patient's last menstrual period was 05/30/2016 (approximate).   here for sonohysterogram.  Had postmenopausal bleeding and pelvic US showing potential mass 6 x 5 mm on 07/13/18.  She had several known fibroids - subserosal and intramural with largest being 32 mm.  Her ovaries were normal.  She declined a biopsy on the date of her ultrasound and preferred to proceed with sonohysterogram.  States she has had two prior dilation and curettage procedures, one following a miscarriage and one for polyps.   GYNECOLOGIC HISTORY: Patient's last menstrual period was 05/30/2016 (approximate). Contraception: Vasectomy/postmenopausal Menopausal hormone therapy:  none Last mammogram: 03-22-18 3D Neg/density  C/biRads2 Last pap smear: 06-30-18 Neg:Neg HR HPV 06-08-16 Neg:Neg HR HPV                OB History    Gravida  5   Para  2   Term  2   Preterm      AB  3   Living  2     SAB  3   TAB      Ectopic      Multiple      Live Births                     Patient Active Problem List   Diagnosis Date Noted  . Postmenopausal bleeding 07/13/2018        Past Medical History:  Diagnosis Date  . Anemia   . Concussion    age 26  . Hip dysplasia   . HSV-1 (herpes simplex virus 1) infection   . Hyperlipidemia 2017  . Migraine          Past Surgical History:  Procedure Laterality Date  . HYSTEROSCOPY  03/2007   resection of polyp and D & C--Dr. Joan Flores  . SALPINGOSTOMY Right 10/1990   --ectopic          Current Outpatient Medications  Medication Sig Dispense Refill  . calcium carbonate (TUMS - DOSED IN MG ELEMENTAL CALCIUM) 500 MG chewable tablet Chew 2 tablets by  mouth daily.    Marland Kitchen ibuprofen (ADVIL,MOTRIN) 100 MG tablet Take 100 mg by mouth every 8 (eight) hours as needed for fever.    . Misc Natural Products (ESTROVEN + ENERGY MAX STRENGTH PO) Take 1 tablet by mouth daily.    . Multiple Vitamin (MULTIVITAMIN) capsule Take 1 capsule by mouth daily.    . rosuvastatin (CRESTOR) 10 MG tablet Take 1 tablet by mouth daily.  1  . valACYclovir (VALTREX) 1000 MG tablet Take 1 tablet (1,000mg ) q 12 hours for 24 hours. Then take 1 tablet (1,000mg ) daily for suppression. 30 tablet 11   No current facility-administered medications for this visit.      ALLERGIES: Doxycycline and Bactrim [sulfamethoxazole-trimethoprim]       Family History  Problem Relation Age of Onset  . Migraines Mother   . Hypertension Mother   . Osteoarthritis Mother   . Hyperlipidemia Mother   . Melanoma Mother   . Hypertension Father   . Osteoarthritis Father   . Hyperlipidemia Father   . Cancer Father        oral cancer   . Migraines Sister   . Melanoma Sister   . Diabetes Sister     Social History        Socioeconomic History  . Marital status: Married    Spouse name: Not on file  . Number of children: Not on file  . Years of education: Not on file  . Highest education level: Not on file  Occupational History  . Not on file  Social Needs  . Financial resource strain: Not on file  . Food insecurity:    Worry: Not on file    Inability: Not on file  . Transportation needs:    Medical: Not on file    Non-medical: Not on file  Tobacco Use  . Smoking status: Never Smoker  . Smokeless tobacco: Never Used  Substance and Sexual Activity  . Alcohol use: Yes    Alcohol/week: 10.0 standard drinks    Types: 10 Standard drinks or equivalent per week  . Drug use: No  . Sexual activity: Yes    Partners: Male    Birth control/protection: Other-see comments    Comment: vasectomy  Lifestyle  . Physical activity:    Days per week: Not on file    Minutes per session: Not on file  . Stress: Not on file  Relationships  . Social connections:    Talks on phone: Not on file    Gets together: Not on file    Attends religious service: Not on file    Active member of club or organization: Not on file    Attends meetings of clubs or organizations: Not on file    Relationship status: Not on file  . Intimate partner violence:    Fear of current or ex partner: Not on file    Emotionally abused: Not on file    Physically abused: Not on file    Forced sexual activity: Not on file  Other Topics Concern  . Not on file  Social History Narrative  . Not on file    Review of Systems  [place] other systems reviewed and are negative.   PHYSICAL EXAMINATION:    BP (!) 148/80 (BP Location: Right Arm, Patient Position: Sitting, Cuff Size: Normal)   Pulse 70   Ht 5' 2.5" (1.588 m)   Wt 162 lb (73.5 kg)   LMP 05/30/2016 (Approximate)   BMI 29.16 kg/m  General appearance: alert, cooperative and appears stated age   Pelvic US: Multiple fibroids. EMS 5.68 mm. Ovaries normal.   Sonohysterogram: Sterile prep of cervix with Hibiclens. Cannula placed and sterile saline injected.  2 filling defects noted - 5 mm at fundus and 3 mm at left lower uterine segment.   Chaperone was present for exam.  ASSESSMENT  Postmenopausal bleeding.  Endometrial masses.  PLAN  Patient declines EMB after discussing risks and benefits.  Discussion of postmenopausal bleeding and endometrial masses.. Discussion of hysteroscopy with Myosure polypectomy, dilation and curettage.  Risks, benefits, and alternatives reviewed. Risks include but are not limited to bleeding, infection, damage to surrounding organs including uterine perforation requiring hospitalization and laparoscopy, reaction to anesthesia, DVT, PE, death, need for further treatment and surgery including hysterectomy or medical therapy.   The possibility of negative findings also reviewed. Surgical expectations and recovery discussed.  Patient wishes to proceed.   An After Visit Summary was printed and given to the patient.  ___25___ minutes face to face time of which over 50% was spent in counseling.

## 2018-08-29 NOTE — Op Note (Signed)
OPERATIVE REPORT   PREOPERATIVE DIAGNOSES:   Postmenopausal bleeding, endometrial masses.  POSTOPERATIVE DIAGNOSES:   Postmenopausal bleeding, endometrial masses  PROCEDURE:  Hysteroscopy with dilation and curettage and Myosure resection of endometrial polyp.  SURGEON:  Lenard Galloway, MD  ANESTHESIA:  LMA, paracervical block with 10 mL of 1% lidocaine.  IV FLUIDS:   600 cc LR.  EBL:  5 cc.   URINE OUTPUT:  200 cc.  NORMAL SALINE DEFICIT:   60 cc.   COMPLICATIONS:  None.  INDICATIONS FOR THE PROCEDURE:     The patient is a 52 year old [id number] Caucasian female who presents with postmenopausal bleeding.  Pelvic US showed a potential endometrial mass 6 x 5 mm and several subserosal and intramural fibroids. Her ovaries were normal.  Sonohysterogram confirmed the presence of 2 masses:  5 mm at the fundus and 3 mm at the left lower uterine segment.   A plan is now made to proceed with a hysteroscopy with dilation and curettage and resection of endometrial polyps; after risks, benefits and alternatives were reviewed.  FINDINGS:  Exam under anesthesia revealed a small uterus. No adnexal masses were appreciated. The uterus was sounded to 7 cm.  Hysteroscopy showed a 5 - 6 mm posterior fundal polyp.  There was stranding of the endometrium of the posterior LUS and right cornua.  All of these areas were resected with the Myosure Lite. Endometrial currettings were scanty.   SPECIMENS:  The endometrial polyp and endometrial curettings were sent to Pathology separately.  PROCEDURE IN DETAIL:  The patient was reidentified in the preoperative hold area.  She received TED hose and PAS stockings for DVT prophylaxis.  In the operating room, the patient was placed in the dorsal lithotomy position and then an LMA anesthetic was introduced.  The patient's lower abdomen, vagina and perineum were sterilely prepped with Betadine and the  patient's bladder was catheterized of urine.  She was sterilly  draped  An exam under anesthesia was performed.  A speculum was placed inside the vagina and a single-tooth tenaculum was placed on the anterior cervical lip.  A paracervical block was performed with a total of 10 mL of 1% lidocaine plain.  The uterus was sounded. The cervix was dilated to a #25 Pratt dilator.  The MyoSure hysteroscope was then inserted inside the uterine cavity under the continuous infusion of normal saline solution.  Findings are as noted above.  The MyoSure hysteroscope was removed after the polyp and stranding of the endometrium   were resected with the Lite. The sharp and then a serrated curette were introduced  into the uterine cavity and the endometrium was curetted in all 4 quadrants.  A minimal  amount of endometrial curettings was obtained.  This specimen was sent to Pathology.  The single-tooth tenaculum which had been placed on the anterior cervical lip was  removed.  Hemostasis was good, and all of the vaginal instruments were removed.  The patient was awakened and escorted to the recovery room in stable condition after  she was cleansed with Betadine.  There were no complications to the procedure.  All  needle, instrument and sponge counts were correct.  Lenard Galloway, MD

## 2018-08-29 NOTE — Brief Op Note (Signed)
FLUENT pump, 81ml fluid deficit

## 2018-08-29 NOTE — Anesthesia Preprocedure Evaluation (Signed)
Anesthesia Evaluation  Patient identified by MRN, date of birth, ID band Patient awake    Reviewed: Allergy & Precautions, H&P , NPO status , Patient's Chart, lab work & pertinent test results  Airway Mallampati: II   Neck ROM: full    Dental   Pulmonary neg pulmonary ROS,    breath sounds clear to auscultation       Cardiovascular negative cardio ROS   Rhythm:regular Rate:Normal     Neuro/Psych  Headaches,    GI/Hepatic   Endo/Other    Renal/GU      Musculoskeletal   Abdominal   Peds  Hematology   Anesthesia Other Findings   Reproductive/Obstetrics                             Anesthesia Physical Anesthesia Plan  ASA: II  Anesthesia Plan: General   Post-op Pain Management:    Induction: Intravenous  PONV Risk Score and Plan: 3 and Ondansetron, Dexamethasone, Midazolam and Treatment may vary due to age or medical condition  Airway Management Planned: LMA  Additional Equipment:   Intra-op Plan:   Post-operative Plan:   Informed Consent: I have reviewed the patients History and Physical, chart, labs and discussed the procedure including the risks, benefits and alternatives for the proposed anesthesia with the patient or authorized representative who has indicated his/her understanding and acceptance.     Plan Discussed with: CRNA, Anesthesiologist and Surgeon  Anesthesia Plan Comments:         Anesthesia Quick Evaluation

## 2018-08-29 NOTE — Progress Notes (Signed)
Update to History and Physical  No marked change in status since office pre-op visit.   Patient examined.   OK to proceed with surgery. 

## 2018-08-29 NOTE — Discharge Instructions (Signed)
Dilation and Curettage or Vacuum Curettage, Care After These instructions give you information about caring for yourself after your procedure. Your doctor may also give you more specific instructions. Call your doctor if you have any problems or questions after your procedure. Follow these instructions at home: Activity  Do not drive or use heavy machinery while taking prescription pain medicine.  For 24 hours after your procedure, avoid driving.  Take short walks often, followed by rest periods. Ask your doctor what activities are safe for you. After one or two days, you may be able to return to your normal activities.  Do not lift anything that is heavier than 10 lb (4.5 kg) until your doctor approves.  For at least 2 weeks, or as long as told by your doctor: ? Do not douche. ? Do not use tampons. ? Do not have sex. General instructions   Take over-the-counter and prescription medicines only as told by your doctor. This is very important if you take blood thinning medicine.  Do not take baths, swim, or use a hot tub until your doctor approves. Take showers instead of baths.  Wear compression stockings as told by your doctor.  It is up to you to get the results of your procedure. Ask your doctor when your results will be ready.  Keep all follow-up visits as told by your doctor. This is important. Contact a doctor if:  You have very bad cramps that get worse or do not get better with medicine.  You have very bad pain in your belly (abdomen).  You cannot drink fluids without throwing up (vomiting).  You get pain in a different part of the area between your belly and thighs (pelvis).  You have bad-smelling discharge from your vagina.  You have a rash. Get help right away if:  You are bleeding a lot from your vagina. A lot of bleeding means soaking more than one sanitary pad in an hour, for 2 hours in a row.  You have clumps of blood (blood clots) coming from your  vagina.  You have a fever or chills.  Your belly feels very tender or hard.  You have chest pain.  You have trouble breathing.  You cough up blood.  You feel dizzy.  You feel light-headed.  You pass out (faint).  You have pain in your neck or shoulder area. Summary  Take short walks often, followed by rest periods. Ask your doctor what activities are safe for you. After one or two days, you may be able to return to your normal activities.  Do not lift anything that is heavier than 10 lb (4.5 kg) until your doctor approves.  Do not take baths, swim, or use a hot tub until your doctor approves. Take showers instead of baths.  Contact your doctor if you have any symptoms of infection, like bad-smelling discharge from your vagina. This information is not intended to replace advice given to you by your health care provider. Make sure you discuss any questions you have with your health care provider. Document Released: 05/25/2008 Document Revised: 05/03/2016 Document Reviewed: 05/03/2016 Elsevier Interactive Patient Education  2019 [employer]. Hysteroscopy, Care After This sheet gives you information about how to care for yourself after your procedure. Your health care provider may also give you more specific instructions. If you have problems or questions, contact your health care provider. What can I expect after the procedure? After the procedure, it is common to have:  Cramping.  Bleeding. This can  vary from light spotting to menstrual-like bleeding. Follow these instructions at home: Activity  Rest for 1-2 days after the procedure.  Do not douche, use tampons, or have sex for 2 weeks after the procedure, or until your health care provider approves.  Do not drive for 24 hours after the procedure, or for as long as told by your health care provider.  Do not drive, use heavy machinery, or drink alcohol while taking prescription pain medicines. Medicines   Take  over-the-counter and prescription medicines only as told by your health care provider.  Do not take aspirin during recovery. It can increase the risk of bleeding. General instructions  Do not take baths, swim, or use a hot tub until your health care provider approves. Take showers instead of baths for 2 weeks, or for as long as told by your health care provider.  To prevent or treat constipation while you are taking prescription pain medicine, your health care provider may recommend that you: ? Drink enough fluid to keep your urine clear or pale yellow. ? Take over-the-counter or prescription medicines. ? Eat foods that are high in fiber, such as fresh fruits and vegetables, whole grains, and beans. ? Limit foods that are high in fat and processed sugars, such as fried and sweet foods.  Keep all follow-up visits as told by your health care provider. This is important. Contact a health care provider if:  You feel dizzy or lightheaded.  You feel nauseous.  You have abnormal vaginal discharge.  You have a rash.  You have pain that does not get better with medicine.  You have chills. Get help right away if:  You have bleeding that is heavier than a normal menstrual period.  You have a fever.  You have pain or cramps that get worse.  You develop new abdominal pain.  You faint.  You have pain in your shoulders.  You have shortness of breath. Summary  After the procedure, you may have cramping and some vaginal bleeding.  Do not douche, use tampons, or have sex for 2 weeks after the procedure, or until your health care provider approves.  Do not take baths, swim, or use a hot tub until your health care provider approves. Take showers instead of baths for 2 weeks, or for as long as told by your health care provider.  Report any unusual symptoms to your health care provider.  Keep all follow-up visits as told by your health care provider. This is important. This information  is not intended to replace advice given to you by your health care provider. Make sure you discuss any questions you have with your health care provider. Document Released: 06/06/2013 Document Revised: 09/14/2016 Document Reviewed: 09/14/2016 Elsevier Interactive Patient Education  2019 [employer].

## 2018-08-30 NOTE — Anesthesia Postprocedure Evaluation (Signed)
Anesthesia Post Note  Patient: Allison Crawford  Procedure(s) Performed: DILATATION & CURETTAGE/HYSTEROSCOPY WITH MYOSURE (N/A )     Patient location during evaluation: PACU Anesthesia Type: General Level of consciousness: awake and alert Pain management: pain level controlled Vital Signs Assessment: post-procedure vital signs reviewed and stable Respiratory status: spontaneous breathing, nonlabored ventilation, respiratory function stable and patient connected to nasal cannula oxygen Cardiovascular status: blood pressure returned to baseline and stable Postop Assessment: no apparent nausea or vomiting Anesthetic complications: no    Last Vitals:  Vitals:   08/29/18 1100 08/29/18 1119  BP: 125/78 139/83  Pulse: (!) 56   Resp: 14 16  Temp: 36.6 C 36.6 C  SpO2: 96% 98%    Last Pain:  Vitals:   08/29/18 1119  TempSrc:   PainSc: 0-No pain                 Fuller Makin S

## 2018-08-31 ENCOUNTER — Encounter (HOSPITAL_COMMUNITY): Payer: Self-pay | Admitting: Obstetrics and Gynecology

## 2018-09-11 NOTE — Progress Notes (Signed)
GYNECOLOGY  VISIT   HPI: 53 y.o.   Married  Caucasian  female   [phone] with Patient's last menstrual period was 06/08/2016 (approximate).   here for 2 week follow up [place] (N/A ).   Pathology - benign endometrial polyp and currettings.  No pain and no fever.   Spotting following surgery.  Almost entirely stopped.   GYNECOLOGIC HISTORY: Patient's last menstrual period was 06/08/2016 (approximate). Contraception:  Vasectomy/Postmenopausal Menopausal hormone therapy: none Last mammogram:  03-22-18 3D Neg/density C/biRads2  Last pap smear: 06-30-18 Neg:Neg HR HPV 06-08-16 Neg:Neg HR HPV        OB History    Gravida  5   Para  2   Term  2   Preterm      AB  3   Living  2     SAB  3   TAB      Ectopic      Multiple      Live Births                 Patient Active Problem List   Diagnosis Date Noted  . Endometrial mass 08/13/2018  . Postmenopausal bleeding 07/13/2018    Past Medical History:  Diagnosis Date  . Endometrial mass   . Hip dysplasia   . History of concussion    per pt at age 73 w/ LOC,  no residual  . History of ectopic pregnancy    03/ 1992  s/p  right salpingostomy  . HSV-1 (herpes simplex virus 1) infection   . Hyperlipidemia 2017  . IDA (iron deficiency anemia)   . Migraine   . PMB (postmenopausal bleeding)   . Wears contact lenses     Past Surgical History:  Procedure Laterality Date  . D & C HYSTEROSCOYP W/ RESECTION POLYP  04-24-2007    dr Joan Flores  @[facility]   . DILATATION & CURETTAGE/HYSTEROSCOPY WITH MYOSURE N/A 08/29/2018   Procedure: DILATATION & CURETTAGE/HYSTEROSCOPY WITH MYOSURE;  Surgeon: Nunzio Cobbs, MD;  Location: [facility];  Service: Gynecology;  Laterality: N/A;  possible endometrial polyps  . SALPINGOSTOMY Right 10/1990   --ectopic    Current Outpatient Medications  Medication Sig Dispense Refill  . ibuprofen (ADVIL,MOTRIN) 800 MG tablet  Take 1 tablet (800 mg total) by mouth every 8 (eight) hours as needed. 30 tablet 0  . Misc Natural Products (ESTROVEN + ENERGY MAX STRENGTH PO) Take 1 tablet by mouth daily.    . Multiple Vitamin (MULTIVITAMIN) capsule Take 1 capsule by mouth daily.    . rosuvastatin (CRESTOR) 10 MG tablet Take 1 tablet by mouth daily.  1  . valACYclovir (VALTREX) 1000 MG tablet Take 1 tablet (1,000mg ) q 12 hours for 24 hours. Then take 1 tablet (1,000mg ) daily for suppression. 30 tablet 11   No current facility-administered medications for this visit.      ALLERGIES: Bactrim [sulfamethoxazole-trimethoprim]  Family History  Problem Relation Age of Onset  . Migraines Mother   . Hypertension Mother   . Osteoarthritis Mother   . Hyperlipidemia Mother   . Melanoma Mother   . Hypertension Father   . Osteoarthritis Father   . Hyperlipidemia Father   . Cancer Father        oral cancer  . Migraines Sister   . Melanoma Sister   . Diabetes Sister     Social History   Socioeconomic History  . Marital status: Married    Spouse name: Not on file  .  Number of children: Not on file  . Years of education: Not on file  . Highest education level: Not on file  Occupational History  . Not on file  Social Needs  . Financial resource strain: Not on file  . Food insecurity:    Worry: Not on file    Inability: Not on file  . Transportation needs:    Medical: Not on file    Non-medical: Not on file  Tobacco Use  . Smoking status: Never Smoker  . Smokeless tobacco: Never Used  Substance and Sexual Activity  . Alcohol use: Yes    Alcohol/week: 8.0 standard drinks    Types: 8 Standard drinks or equivalent per week  . Drug use: Never  . Sexual activity: Yes    Partners: Male    Birth control/protection: Post-menopausal    Comment: vasectomy  Lifestyle  . Physical activity:    Days per week: Not on file    Minutes per session: Not on file  . Stress: Not on file  Relationships  . Social connections:     Talks on phone: Not on file    Gets together: Not on file    Attends religious service: Not on file    Active member of club or organization: Not on file    Attends meetings of clubs or organizations: Not on file    Relationship status: Not on file  . Intimate partner violence:    Fear of current or ex partner: Not on file    Emotionally abused: Not on file    Physically abused: Not on file    Forced sexual activity: Not on file  Other Topics Concern  . Not on file  Social History Narrative  . Not on file    Review of Systems  Constitutional: Negative.   HENT: Negative.   Eyes: Negative.   Respiratory: Negative.   Cardiovascular: Negative.   Gastrointestinal: Negative.   Endocrine: Negative.   Genitourinary: Negative.   Musculoskeletal: Negative.   Skin: Negative.   Allergic/Immunologic: Negative.   Neurological: Negative.   Hematological: Negative.   Psychiatric/Behavioral: Negative.     PHYSICAL EXAMINATION:    BP 128/70 (BP Location: Right Arm, Patient Position: Sitting, Cuff Size: Large)   Pulse 68   Resp 12   Ht 5' 2.5" (1.588 m)   Wt 163 lb (73.9 kg)   LMP 06/08/2016 (Approximate)   BMI 29.34 kg/m     General appearance: alert, cooperative and appears stated age    Pelvic: External genitalia:  no lesions              Urethra:  normal appearing urethra with no masses, tenderness or lesions              Bartholins and Skenes: normal                 Vagina: normal appearing vagina with normal color and discharge, no lesions              Cervix: no lesions.  Small amount of vaginal bleeding noted.                 Bimanual Exam:  Uterus:  normal size, contour, position, consistency, mobility, non-tender              Adnexa: no mass, fullness, tenderness                Chaperone was present for exam.  ASSESSMENT  Status post hysteroscopy with  polypectomy and dilation and curettage for benign endometrial polyp. No sign of endometritis.    PLAN  Reviewed surgical findings, procedure, and pathology report.  Call if bleeding persists after another week.  FU for annual exam and prn.    An After Visit Summary was printed and given to the patient.

## 2018-09-13 ENCOUNTER — Encounter: Payer: Self-pay | Admitting: Obstetrics and Gynecology

## 2018-09-13 ENCOUNTER — Ambulatory Visit (INDEPENDENT_AMBULATORY_CARE_PROVIDER_SITE_OTHER): Payer: BLUE CROSS/BLUE SHIELD | Admitting: Obstetrics and Gynecology

## 2018-09-13 VITALS — BP 128/70 | HR 68 | Resp 12 | Ht 62.5 in | Wt 163.0 lb

## 2018-09-13 DIAGNOSIS — Z9889 Other specified postprocedural states: Secondary | ICD-10-CM

## 2018-09-14 DIAGNOSIS — D2261 Melanocytic nevi of right upper limb, including shoulder: Secondary | ICD-10-CM | POA: Diagnosis not present

## 2018-09-14 DIAGNOSIS — D2262 Melanocytic nevi of left upper limb, including shoulder: Secondary | ICD-10-CM | POA: Diagnosis not present

## 2018-09-14 DIAGNOSIS — D2271 Melanocytic nevi of right lower limb, including hip: Secondary | ICD-10-CM | POA: Diagnosis not present

## 2018-09-14 DIAGNOSIS — D224 Melanocytic nevi of scalp and neck: Secondary | ICD-10-CM | POA: Diagnosis not present

## 2018-09-26 DIAGNOSIS — F4320 Adjustment disorder, unspecified: Secondary | ICD-10-CM | POA: Diagnosis not present

## 2018-10-03 ENCOUNTER — Ambulatory Visit
Admission: RE | Admit: 2018-10-03 | Discharge: 2018-10-03 | Disposition: A | Discharge status: Home / Self Care | Payer: BLUE CROSS/BLUE SHIELD | Source: Ambulatory Visit | Attending: Internal Medicine | Admitting: Internal Medicine

## 2018-10-03 ENCOUNTER — Other Ambulatory Visit: Payer: Self-pay | Admitting: Internal Medicine

## 2018-10-03 DIAGNOSIS — R519 Headache, unspecified: Secondary | ICD-10-CM

## 2018-10-03 DIAGNOSIS — R51 Headache: Principal | ICD-10-CM

## 2018-10-03 DIAGNOSIS — R05 Cough: Secondary | ICD-10-CM | POA: Diagnosis not present

## 2018-10-03 DIAGNOSIS — R509 Fever, unspecified: Secondary | ICD-10-CM | POA: Diagnosis not present

## 2018-10-03 DIAGNOSIS — R6889 Other general symptoms and signs: Secondary | ICD-10-CM | POA: Diagnosis not present

## 2018-10-04 DIAGNOSIS — I10 Essential (primary) hypertension: Secondary | ICD-10-CM | POA: Diagnosis not present

## 2018-10-04 DIAGNOSIS — J0100 Acute maxillary sinusitis, unspecified: Secondary | ICD-10-CM | POA: Diagnosis not present

## 2018-10-05 ENCOUNTER — Other Ambulatory Visit: Payer: BLUE CROSS/BLUE SHIELD

## 2018-11-07 DIAGNOSIS — F4320 Adjustment disorder, unspecified: Secondary | ICD-10-CM | POA: Diagnosis not present

## 2018-12-05 DIAGNOSIS — I10 Essential (primary) hypertension: Secondary | ICD-10-CM | POA: Diagnosis not present

## 2018-12-05 DIAGNOSIS — E7800 Pure hypercholesterolemia, unspecified: Secondary | ICD-10-CM | POA: Diagnosis not present

## 2019-02-06 DIAGNOSIS — U071 COVID-19: Secondary | ICD-10-CM | POA: Diagnosis not present

## 2019-03-28 ENCOUNTER — Encounter: Payer: Self-pay | Admitting: Obstetrics and Gynecology

## 2019-03-28 DIAGNOSIS — Z1231 Encounter for screening mammogram for malignant neoplasm of breast: Secondary | ICD-10-CM | POA: Diagnosis not present

## 2019-05-08 DIAGNOSIS — F4320 Adjustment disorder, unspecified: Secondary | ICD-10-CM | POA: Diagnosis not present

## 2019-05-14 DIAGNOSIS — Z1159 Encounter for screening for other viral diseases: Secondary | ICD-10-CM | POA: Diagnosis not present

## 2019-05-14 DIAGNOSIS — E7800 Pure hypercholesterolemia, unspecified: Secondary | ICD-10-CM | POA: Diagnosis not present

## 2019-05-14 DIAGNOSIS — I10 Essential (primary) hypertension: Secondary | ICD-10-CM | POA: Diagnosis not present

## 2019-05-21 DIAGNOSIS — Z8249 Family history of ischemic heart disease and other diseases of the circulatory system: Secondary | ICD-10-CM | POA: Diagnosis not present

## 2019-05-21 DIAGNOSIS — Z6830 Body mass index (BMI) 30.0-30.9, adult: Secondary | ICD-10-CM | POA: Diagnosis not present

## 2019-05-21 DIAGNOSIS — Z0001 Encounter for general adult medical examination with abnormal findings: Secondary | ICD-10-CM | POA: Diagnosis not present

## 2019-05-21 DIAGNOSIS — J309 Allergic rhinitis, unspecified: Secondary | ICD-10-CM | POA: Diagnosis not present

## 2019-05-23 DIAGNOSIS — R740 Nonspecific elevation of levels of transaminase and lactic acid dehydrogenase [LDH]: Secondary | ICD-10-CM | POA: Diagnosis not present

## 2019-06-13 DIAGNOSIS — M25531 Pain in right wrist: Secondary | ICD-10-CM | POA: Diagnosis not present

## 2019-06-13 DIAGNOSIS — M25532 Pain in left wrist: Secondary | ICD-10-CM | POA: Diagnosis not present

## 2019-06-13 DIAGNOSIS — G5603 Carpal tunnel syndrome, bilateral upper limbs: Secondary | ICD-10-CM | POA: Diagnosis not present

## 2019-07-09 ENCOUNTER — Other Ambulatory Visit: Payer: Self-pay

## 2019-07-10 NOTE — Progress Notes (Signed)
53 y.o. [id number] Married Caucasian female here for annual exam.    No bleeding or spotting.  Having hot flashes.   Father passed with COVID 03/2019.  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   Saw PCP for her wellness visit this fall.   PCP:   Deland Pretty, MD  Patient's last menstrual period was 06/08/2016 (approximate).           Sexually active: Yes.    The current method of family planning is vasectomy.    Exercising: Yes.    biking and walking Smoker:  no  Health Maintenance: Pap: 06-30-18 Neg:Neg HR HPV,06-08-16 Neg:Neg HR HPV,05/18/13 neg HPV HR neg History of abnormal Pap:  no MMG: 03-28-19 3D/Neg/density C/biRads1 Colonoscopy: 03-14-15 Neg.with Dr.Mann;next due 02/2025 BMD: n/a  Result  n/a TDaP:  2015 Gardasil:   no HIV:no Hep C: Neg with PCP Screening Labs:  PCP.  Flu vaccine:  Completed.   reports that she has never smoked. She has never used smokeless tobacco. She reports current alcohol use of about 6.0 standard drinks of alcohol per week. She reports that she does not use drugs.  Past Medical History:  Diagnosis Date  . Endometrial mass   . Hip dysplasia   . History of concussion    per pt at age 64 w/ LOC,  no residual  . History of ectopic pregnancy    03/ 1992  s/p  right salpingostomy  . HSV-1 (herpes simplex virus 1) infection   . Hyperlipidemia 2017  . IDA (iron deficiency anemia)   . Migraine   . PMB (postmenopausal bleeding)   . Wears contact lenses     Past Surgical History:  Procedure Laterality Date  . D & C HYSTEROSCOYP W/ RESECTION POLYP  04-24-2007    dr Joan Flores  @[facility]   . [place] N/A 08/29/2018   Procedure: [place];  Surgeon: Nunzio Cobbs, MD;  Location: [facility];  Service: Gynecology;  Laterality: N/A;  possible endometrial polyps  . SALPINGOSTOMY Right 10/1990   --ectopic    Current Outpatient Medications  Medication Sig Dispense Refill   . calcium carbonate (TUMS - DOSED IN MG ELEMENTAL CALCIUM) 500 MG chewable tablet Chew 1 tablet by mouth daily.    . Cholecalciferol (VITAMIN D3 PO) Take 1 capsule by mouth daily. Takes 2000 IU daily    . ibuprofen (ADVIL,MOTRIN) 800 MG tablet Take 1 tablet (800 mg total) by mouth every 8 (eight) hours as needed. 30 tablet 0  . Misc Natural Products (ESTROVEN + ENERGY MAX STRENGTH PO) Take 1 tablet by mouth daily.    . Multiple Vitamin (MULTIVITAMIN) capsule Take 1 capsule by mouth daily.    . rosuvastatin (CRESTOR) 10 MG tablet Take 1 tablet by mouth daily.  1  . Turmeric 500 MG CAPS Take 1 tablet by mouth daily.    . valACYclovir (VALTREX) 1000 MG tablet Take 1 tablet (1,000mg ) q 12 hours for 24 hours. Then take 1 tablet (1,000mg ) daily for suppression. 30 tablet 11   No current facility-administered medications for this visit.     Family History  Problem Relation Age of Onset  . Migraines Mother   . Hypertension Mother   . Osteoarthritis Mother   . Hyperlipidemia Mother   . Melanoma Mother   . Hypertension Father   . Osteoarthritis Father   . Hyperlipidemia Father   . Cancer Father        oral cancer  .  Migraines Sister   . Melanoma Sister   . Diabetes Sister     Review of Systems  All other systems reviewed and are negative.   Exam:   BP 138/82   Pulse 64   Temp (!) 97.2 F (36.2 C) (Temporal)   Resp 16   Ht 5' 2.25" (1.581 m)   Wt 169 lb (76.7 kg)   LMP 06/08/2016 (Approximate)   BMI 30.66 kg/m     General appearance: alert, cooperative and appears stated age Head: normocephalic, without obvious abnormality, atraumatic Neck: no adenopathy, supple, symmetrical, trachea midline and thyroid normal to inspection and palpation Lungs: clear to auscultation bilaterally Breasts: normal appearance, no masses or tenderness, No nipple retraction or dimpling, No nipple discharge or bleeding, No axillary adenopathy Heart: regular rate and rhythm Abdomen: soft, non-tender;  no masses, no organomegaly Extremities: extremities normal, atraumatic, no cyanosis or edema Skin: skin color, texture, turgor normal. No rashes or lesions Lymph nodes: cervical, supraclavicular, and axillary nodes normal. Neurologic: grossly normal  Pelvic: External genitalia:  no lesions              No abnormal inguinal nodes palpated.              Urethra:  normal appearing urethra with no masses, tenderness or lesions              Bartholins and Skenes: normal                 Vagina: normal appearing vagina with normal color and discharge, no lesions.  Second degree rectocele.               Cervix: no lesions              Pap taken: No. Bimanual Exam:  Uterus:  normal size, contour, position, consistency, mobility, non-tender              Adnexa: no mass, fullness, tenderness              Rectal exam: Yes.  .  Confirms.              Anus:  normal sphincter tone, no lesions  Chaperone was present for exam.  Assessment:   Well woman visit with normal exam. Status post hysteroscopy with polypectomy.  Hx HSV 1.  Hyperlipidemia.  Asymptomatic rectocele.  Plan: Mammogram screening discussed. Self breast awareness reviewed. Pap and HR HPV 2024.  Guidelines for Calcium, Vitamin D, regular exercise program including cardiovascular and weight bearing exercise. We discussed herbal remedies, Gabapentin, and SSRIs, SNRIs.  She will consider these. We reviewed Covid guidelines for prevention.  Follow up annually and prn.  After visit summary provided.

## 2019-07-11 ENCOUNTER — Encounter: Payer: Self-pay | Admitting: Obstetrics and Gynecology

## 2019-07-11 ENCOUNTER — Ambulatory Visit: Payer: BC Managed Care – PPO | Admitting: Obstetrics and Gynecology

## 2019-07-11 ENCOUNTER — Other Ambulatory Visit: Payer: Self-pay

## 2019-07-11 VITALS — BP 138/82 | HR 64 | Temp 97.2°F | Resp 16 | Ht 62.25 in | Wt 169.0 lb

## 2019-07-11 DIAGNOSIS — Z01419 Encounter for gynecological examination (general) (routine) without abnormal findings: Secondary | ICD-10-CM

## 2019-07-11 MED ORDER — VALACYCLOVIR HCL 1 G PO TABS: ORAL_TABLET | ORAL | 3 refills | Status: DC

## 2019-07-11 NOTE — Patient Instructions (Signed)

## 2019-10-25 ENCOUNTER — Ambulatory Visit: Payer: BC Managed Care – PPO | Attending: Internal Medicine

## 2019-10-25 DIAGNOSIS — Z23 Encounter for immunization: Secondary | ICD-10-CM | POA: Insufficient documentation

## 2019-10-25 NOTE — Progress Notes (Signed)
   Covid-19 Vaccination Clinic  Name:  Allison Crawford    MRN: [id number] DOB: [birth year]  10/25/2019  [name] was observed post Covid-19 immunization for 15 minutes without incidence. She was provided with Vaccine Information Sheet and instruction to access the V-Safe system.   [name] was instructed to call 911 with any severe reactions post vaccine: Marland Kitchen Difficulty breathing  . Swelling of your face and throat  . A fast heartbeat  . A bad rash all over your body  . Dizziness and weakness    Immunizations Administered    Name Date Dose VIS Date Route   Pfizer COVID-19 Vaccine 10/25/2019 10:44 AM 0.3 mL 08/10/2019 Intramuscular   Manufacturer: [employer]   Lot: [id number]   [place]: [id number]

## 2019-11-08 DIAGNOSIS — D2261 Melanocytic nevi of right upper limb, including shoulder: Secondary | ICD-10-CM | POA: Diagnosis not present

## 2019-11-08 DIAGNOSIS — D2262 Melanocytic nevi of left upper limb, including shoulder: Secondary | ICD-10-CM | POA: Diagnosis not present

## 2019-11-08 DIAGNOSIS — K130 Diseases of lips: Secondary | ICD-10-CM | POA: Diagnosis not present

## 2019-11-08 DIAGNOSIS — D2272 Melanocytic nevi of left lower limb, including hip: Secondary | ICD-10-CM | POA: Diagnosis not present

## 2019-11-13 DIAGNOSIS — Z7189 Other specified counseling: Secondary | ICD-10-CM | POA: Diagnosis not present

## 2019-11-13 DIAGNOSIS — Z20828 Contact with and (suspected) exposure to other viral communicable diseases: Secondary | ICD-10-CM | POA: Diagnosis not present

## 2019-11-13 DIAGNOSIS — R7401 Elevation of levels of liver transaminase levels: Secondary | ICD-10-CM | POA: Diagnosis not present

## 2019-11-14 ENCOUNTER — Ambulatory Visit: Payer: BC Managed Care – PPO | Attending: Internal Medicine

## 2019-11-14 DIAGNOSIS — Z23 Encounter for immunization: Secondary | ICD-10-CM

## 2019-11-14 NOTE — Progress Notes (Signed)
   Covid-19 Vaccination Clinic  Name:  Allison Crawford    MRN: [id number] DOB: 1965-09-14  11/14/2019  [name] was observed post Covid-19 immunization for 15 minutes without incident. She was provided with Vaccine Information Sheet and instruction to access the V-Safe system.   [name] was instructed to call 911 with any severe reactions post vaccine: Marland Kitchen Difficulty breathing  . Swelling of face and throat  . A fast heartbeat  . A bad rash all over body  . Dizziness and weakness   Immunizations Administered    Name Date Dose VIS Date Route   Pfizer COVID-19 Vaccine 11/14/2019 12:31 PM 0.3 mL 08/10/2019 Intramuscular   Manufacturer: [place]   Lot: [id number]   [place]: [id number]

## 2020-01-11 IMAGING — CT CT HEAD W/O CM
3 of 4 series · 15 of 47 positions shown, 18 images · non-contrast
Comparison: None.

CLINICAL DATA: Headaches, more severe on the left than on the right

EXAM:
CT HEAD WITHOUT CONTRAST
TECHNIQUE: Contiguous axial images were obtained from the base of the skull
through the vertex without intravenous contrast.

[Series 2: head 5.00 hr40 s3 ibhc · axial · 0.45mm/px · z∈[-607,-477]mm · 9 of 32 slices shown, 12 images]
[im 3/32  brain]
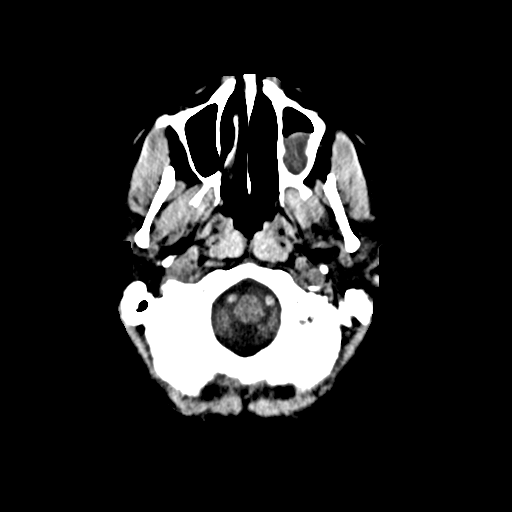
[im 3/32  bone]
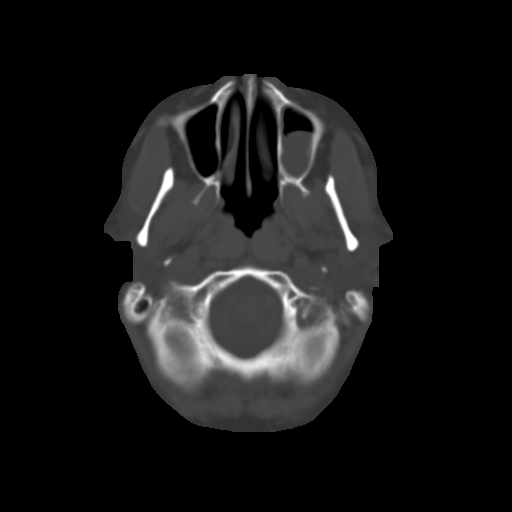
[im 7/32  brain]
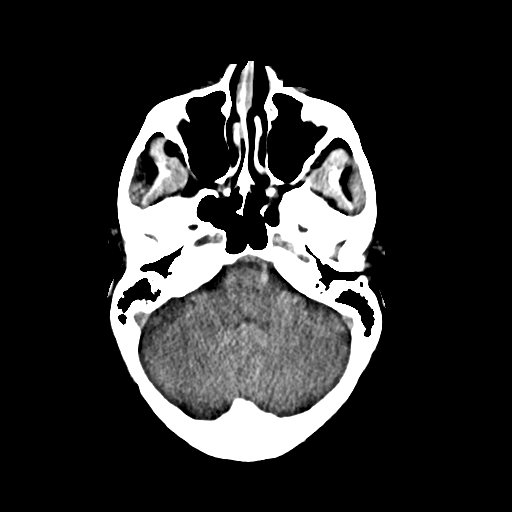
[im 9/32  brain]
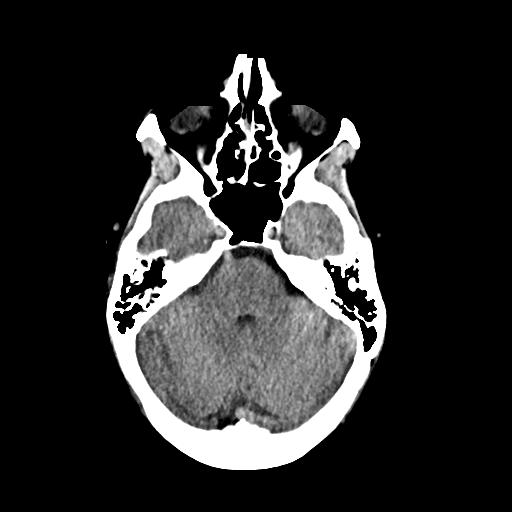
[im 14/32  brain]
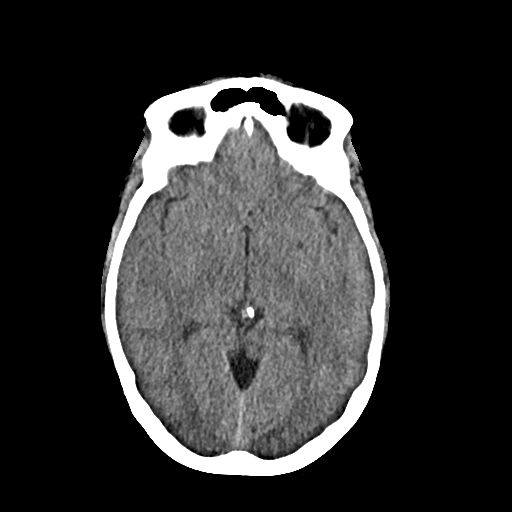
[im 16/32  brain]
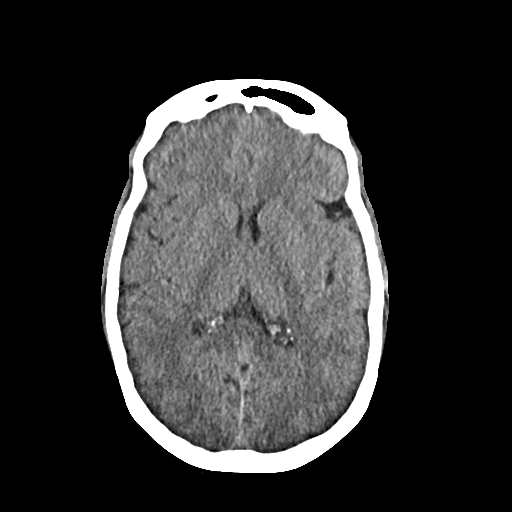
[im 16/32  bone]
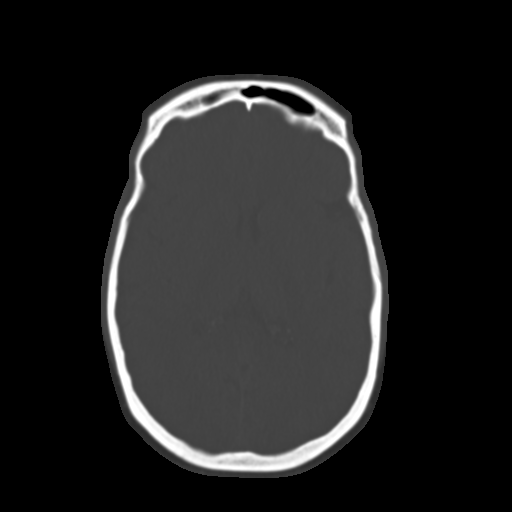
[im 18/32  brain]
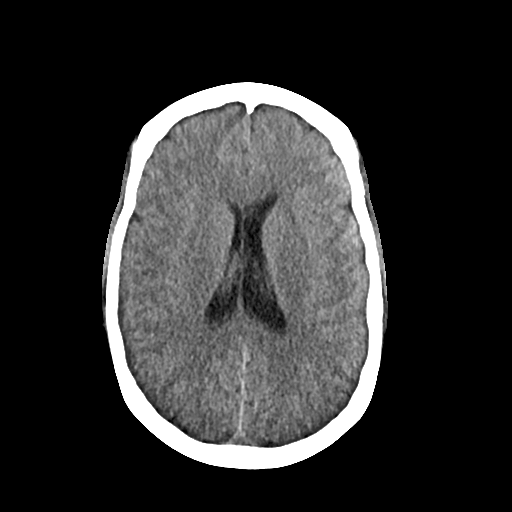
[im 23/32  brain]
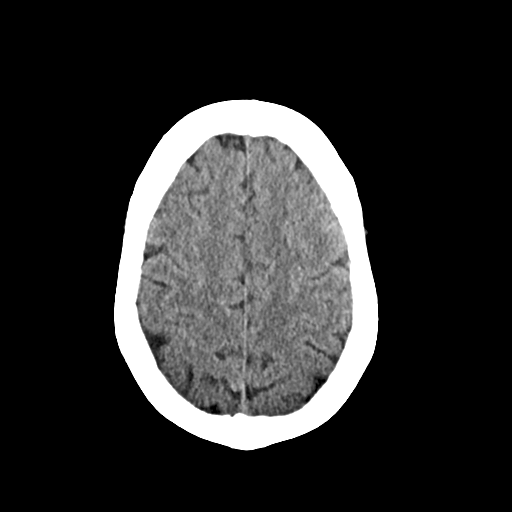
[im 25/32  brain]
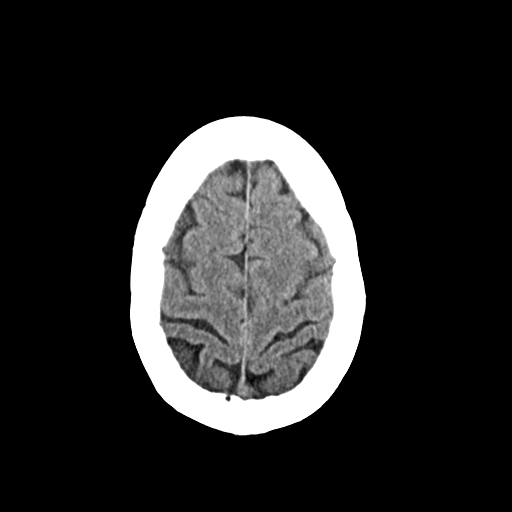
[im 29/32  brain]
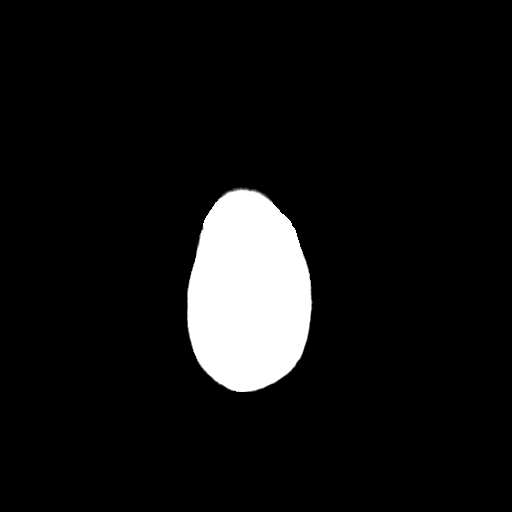
[im 29/32  bone]
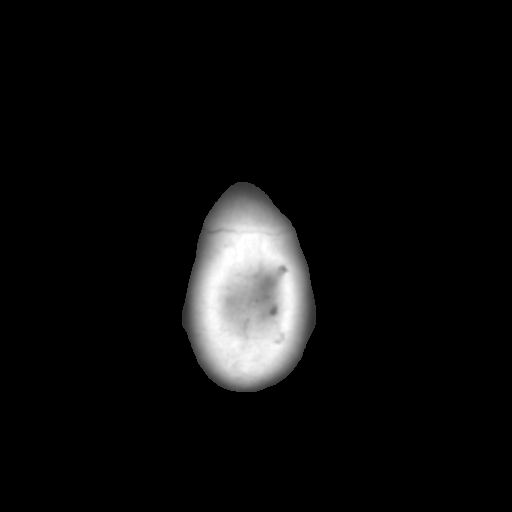

[Series 4: head 3.00 hr40 s3 sag · sagittal · 0.30mm/px · 3 of 56 slices shown]
[im 19/56  brain]
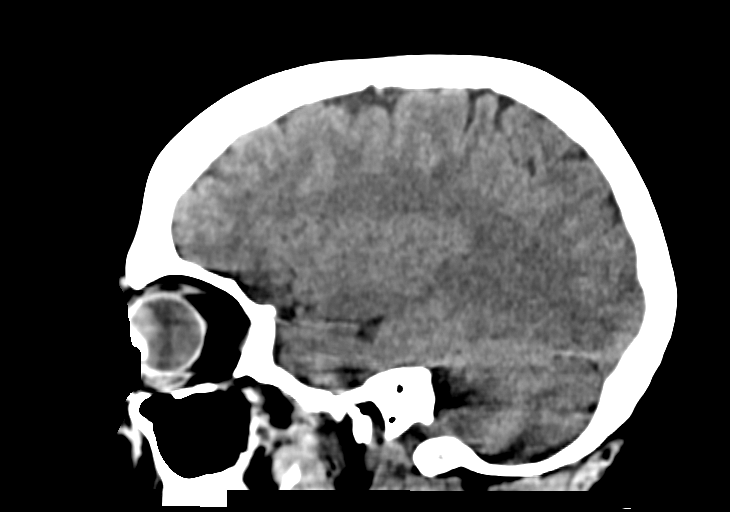
[im 28/56  brain]
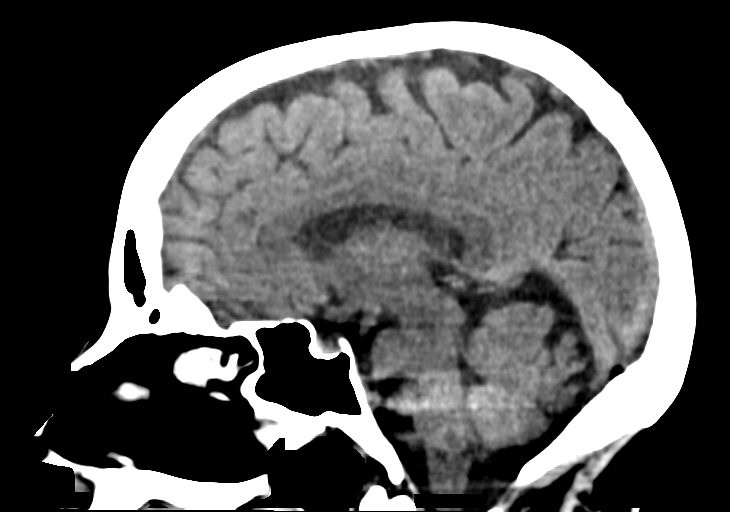
[im 37/56  brain]
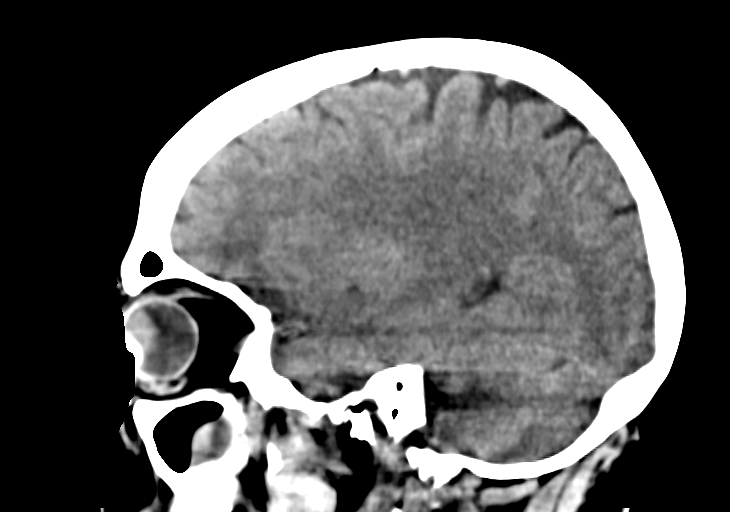

[Series 6: head 3.00 hr40 s3 cor · coronal · 0.30mm/px · 3 of 74 slices shown]
[im 25/74  brain]
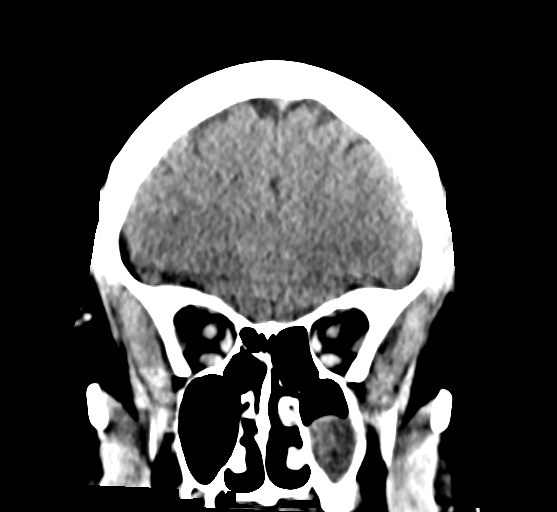
[im 33/74  brain]
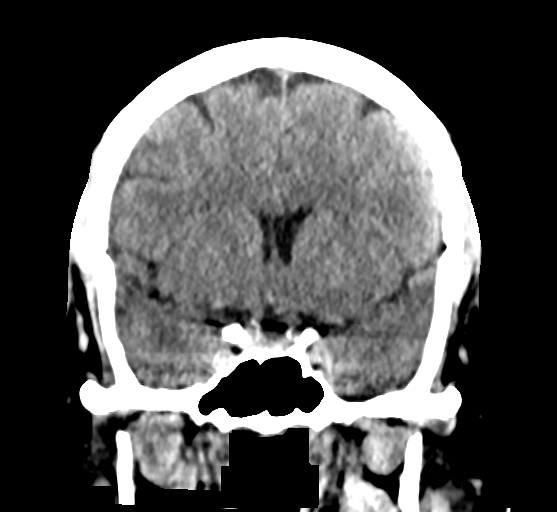
[im 41/74  brain]
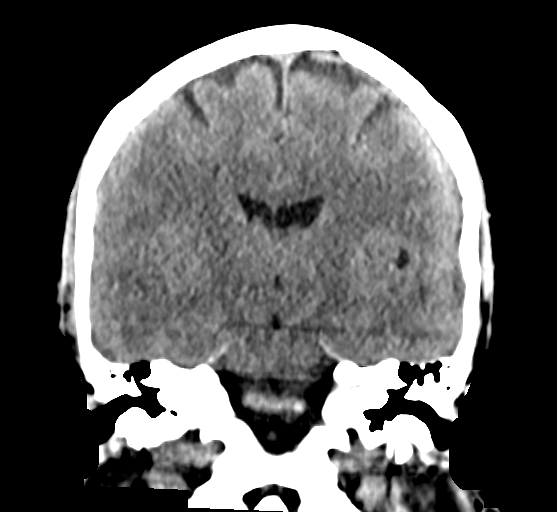

[15 of 47 positions shown; findings below may reference images not displayed]

FINDINGS: Brain: The ventricles are normal in size and configuration. The left
lateral ventricle is slightly larger than the right lateral
ventricle, a likely anatomic variant. There is no intracranial mass,
hemorrhage, extra-axial fluid collection, or midline shift. The
brain parenchyma appears unremarkable. No acute infarct evident.

Vascular: No hyperdense vessel. No appreciable vascular
calcification evident.

Skull: The bony calvarium appears intact.

Sinuses/Orbits: There is a retention cyst in the inferior left
maxillary antrum. There is mucosal1 thickening anteriorly in the
left maxillary antrum. There is mucosal thickening involving several
ethmoid air cells. Orbits appear symmetric bilaterally.

Other: Mastoid air cells are clear.
IMPRESSION: Foci of paranasal sinus disease.  Study otherwise unremarkable.

## 2020-04-02 DIAGNOSIS — Z1231 Encounter for screening mammogram for malignant neoplasm of breast: Secondary | ICD-10-CM | POA: Diagnosis not present

## 2020-05-02 ENCOUNTER — Encounter: Payer: Self-pay | Admitting: Obstetrics and Gynecology

## 2020-07-02 DIAGNOSIS — Z0000 Encounter for general adult medical examination without abnormal findings: Secondary | ICD-10-CM | POA: Diagnosis not present

## 2020-07-02 DIAGNOSIS — I10 Essential (primary) hypertension: Secondary | ICD-10-CM | POA: Diagnosis not present

## 2020-07-07 DIAGNOSIS — J309 Allergic rhinitis, unspecified: Secondary | ICD-10-CM | POA: Diagnosis not present

## 2020-07-07 DIAGNOSIS — Z0000 Encounter for general adult medical examination without abnormal findings: Secondary | ICD-10-CM | POA: Diagnosis not present

## 2020-07-07 DIAGNOSIS — E7800 Pure hypercholesterolemia, unspecified: Secondary | ICD-10-CM | POA: Diagnosis not present

## 2020-07-07 DIAGNOSIS — R7401 Elevation of levels of liver transaminase levels: Secondary | ICD-10-CM | POA: Diagnosis not present

## 2020-07-10 NOTE — Progress Notes (Deleted)
54 y.o. [id number] Married Caucasian female here for annual exam.    PCP:     Patient's last menstrual period was 06/08/2016 (approximate).           Sexually active: {yes no:314532}  The current method of family planning is vasectomy.    Exercising: {yes no:314532}  {types:19826} Smoker:  no  Health Maintenance: Pap:  06-30-18 Neg:Neg HR HPV,06-08-16 Neg:Neg HR HPV,05/18/13 neg HPV HR neg History of abnormal Pap:  no MMG: 04-02-20 3D/neg/density B/BiRads1 Colonoscopy: 03-14-15 normal;next 02/2025 BMD:   n/a  Result  n/a TDaP:  2015 Gardasil:   no HIV: Neg in pregnancy Hep C: Neg with PCP Screening Labs:  Hb today: ***, Urine today: ***   reports that she has never smoked. She has never used smokeless tobacco. She reports current alcohol use of about 6.0 standard drinks of alcohol per week. She reports that she does not use drugs.  Past Medical History:  Diagnosis Date  . Endometrial mass   . Hip dysplasia   . History of concussion    per pt at age 42 w/ LOC,  no residual  . History of ectopic pregnancy    03/ 1992  s/p  right salpingostomy  . HSV-1 (herpes simplex virus 1) infection   . Hyperlipidemia 2017  . IDA (iron deficiency anemia)   . Migraine   . PMB (postmenopausal bleeding)   . Wears contact lenses     Past Surgical History:  Procedure Laterality Date  . D & C HYSTEROSCOYP W/ RESECTION POLYP  04-24-2007    dr Joan Flores  @[facility]   . DILATATION & CURETTAGE/HYSTEROSCOPY WITH MYOSURE N/A 08/29/2018   Procedure: DILATATION & CURETTAGE/HYSTEROSCOPY WITH MYOSURE;  Surgeon: Nunzio Cobbs, MD;  Location: [facility];  Service: Gynecology;  Laterality: N/A;  possible endometrial polyps  . SALPINGOSTOMY Right 10/1990   --ectopic    Current Outpatient Medications  Medication Sig Dispense Refill  . calcium carbonate (TUMS - DOSED IN MG ELEMENTAL CALCIUM) 500 MG chewable tablet Chew 1 tablet by mouth daily.    . Cholecalciferol (VITAMIN D3 PO) Take 1 capsule by mouth daily.  Takes 2000 IU daily    . ibuprofen (ADVIL,MOTRIN) 800 MG tablet Take 1 tablet (800 mg total) by mouth every 8 (eight) hours as needed. 30 tablet 0  . Misc Natural Products (ESTROVEN + ENERGY MAX STRENGTH PO) Take 1 tablet by mouth daily.    . Multiple Vitamin (MULTIVITAMIN) capsule Take 1 capsule by mouth daily.    . rosuvastatin (CRESTOR) 10 MG tablet Take 1 tablet by mouth daily.  1  . Turmeric 500 MG CAPS Take 1 tablet by mouth daily.    . valACYclovir (VALTREX) 1000 MG tablet Take 1 tablet (1,000mg ) q 12 hours for 24 hours. Then take 1 tablet (1,000mg ) daily for suppression. 90 tablet 3   No current facility-administered medications for this visit.    Family History  Problem Relation Age of Onset  . Migraines Mother   . Hypertension Mother   . Osteoarthritis Mother   . Hyperlipidemia Mother   . Melanoma Mother   . Hypertension Father   . Osteoarthritis Father   . Hyperlipidemia Father   . Cancer Father        oral cancer  . Migraines Sister   . Melanoma Sister   . Diabetes Sister     Review of Systems  Exam:   LMP 06/08/2016 (Approximate)     General appearance: alert, cooperative and appears stated age  Head: normocephalic, without obvious abnormality, atraumatic Neck: no adenopathy, supple, symmetrical, trachea midline and thyroid normal to inspection and palpation Lungs: clear to auscultation bilaterally Breasts: normal appearance, no masses or tenderness, No nipple retraction or dimpling, No nipple discharge or bleeding, No axillary adenopathy Heart: regular rate and rhythm Abdomen: soft, non-tender; no masses, no organomegaly Extremities: extremities normal, atraumatic, no cyanosis or edema Skin: skin color, texture, turgor normal. No rashes or lesions Lymph nodes: cervical, supraclavicular, and axillary nodes normal. Neurologic: grossly normal  Pelvic: External genitalia:  no lesions              No abnormal inguinal nodes palpated.              Urethra:  normal  appearing urethra with no masses, tenderness or lesions              Bartholins and Skenes: normal                 Vagina: normal appearing vagina with normal color and discharge, no lesions              Cervix: no lesions              Pap taken: {yes no:314532} Bimanual Exam:  Uterus:  normal size, contour, position, consistency, mobility, non-tender              Adnexa: no mass, fullness, tenderness              Rectal exam: {yes no:314532}.  Confirms.              Anus:  normal sphincter tone, no lesions  Chaperone was present for exam.  Assessment:   Well woman visit with normal exam.   Plan: Mammogram screening discussed. Self breast awareness reviewed. Pap and HR HPV as above. Guidelines for Calcium, Vitamin D, regular exercise program including cardiovascular and weight bearing exercise.   Follow up annually and prn.   Additional counseling given.  {yes [id number]. _______ minutes face to face time of which over 50% was spent in counseling.    After visit summary provided.

## 2020-07-14 ENCOUNTER — Telehealth: Payer: Self-pay

## 2020-07-14 ENCOUNTER — Ambulatory Visit: Payer: BC Managed Care – PPO | Admitting: Obstetrics and Gynecology

## 2020-07-14 NOTE — Telephone Encounter (Signed)
Patient's aex was cancelled because provider delay in surgery. She would like to be worked in before January because she will no longer be in [place] after December.

## 2020-07-14 NOTE — Telephone Encounter (Signed)
Spoke with patient and rescheduled AEX 08-27-20 10:30 with Dr.Silva.

## 2020-08-25 ENCOUNTER — Telehealth: Payer: Self-pay

## 2020-08-25 NOTE — Telephone Encounter (Signed)
I would like to talk with Dr. Edward Jolly for just a few minutes if possible. We are moving and I won't be rescheduling. I would like to thank her for all our years together and her excellent care, and to let her know of our plans, since I know she will be interested!

## 2020-08-26 NOTE — Telephone Encounter (Signed)
I returned the call to the patient and left her a message that I would like to hear her plans through a call or a note!  I offered my assistance is she needs anything.

## 2020-08-26 NOTE — Telephone Encounter (Signed)
Phone call with patient who is moving to [place] on Jan. 8, 2022.  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   She may request a copy of her medical records and get her mammogram images from [facility].   I wish her well!

## 2020-08-27 ENCOUNTER — Ambulatory Visit: Payer: BC Managed Care – PPO | Admitting: Obstetrics and Gynecology

## 2020-09-05 ENCOUNTER — Other Ambulatory Visit: Payer: Self-pay

## 2020-09-05 NOTE — Telephone Encounter (Signed)
Last annual was 07/2019. I notice the last note in her chart said she will be moving to [place] on 09/06/2020.

## 2020-09-08 MED ORDER — VALACYCLOVIR HCL 1 G PO TABS: ORAL_TABLET | ORAL | 0 refills | Status: AC
